# Patient Record
Sex: Male | Born: 1965
Health system: Southern US, Community
[De-identification: ages and names within clinical notes are randomized; demographics above are authoritative.]

## PROBLEM LIST (undated history)

## (undated) DIAGNOSIS — F419 Anxiety disorder, unspecified: Secondary | ICD-10-CM

## (undated) DIAGNOSIS — R972 Elevated prostate specific antigen [PSA]: Secondary | ICD-10-CM

## (undated) HISTORY — DX: Anxiety disorder, unspecified: F41.9

## (undated) HISTORY — DX: Elevated prostate specific antigen (PSA): R97.20

---

## 2006-04-24 ENCOUNTER — Ambulatory Visit: Payer: Self-pay | Admitting: Internal Medicine

## 2006-08-06 ENCOUNTER — Ambulatory Visit: Payer: Self-pay | Admitting: Family Medicine

## 2006-08-07 ENCOUNTER — Encounter: Admission: RE | Admit: 2006-08-07 | Discharge: 2006-08-07 | Payer: Self-pay | Admitting: Family Medicine

## 2006-08-24 ENCOUNTER — Ambulatory Visit: Payer: Self-pay | Admitting: Internal Medicine

## 2007-01-12 ENCOUNTER — Encounter (INDEPENDENT_AMBULATORY_CARE_PROVIDER_SITE_OTHER): Payer: Self-pay | Admitting: Family Medicine

## 2007-01-12 ENCOUNTER — Ambulatory Visit: Payer: Self-pay | Admitting: Family Medicine

## 2008-03-13 IMAGING — CR DG FINGER LITTLE 2+V*R*
1 series · 1 of 1 positions shown · non-contrast
Comparison: none

CLINICAL DATA: Hyperextension injury with soccer ball three days ago with DIP joint pain, right fifth finger.
 RIGHT FIFTH FINGER, THREE VIEWS:
 There is no evidence of fracture or focal bone lesions.  No other significant bone or soft tissue abnormalities are identified.

[view not recorded]
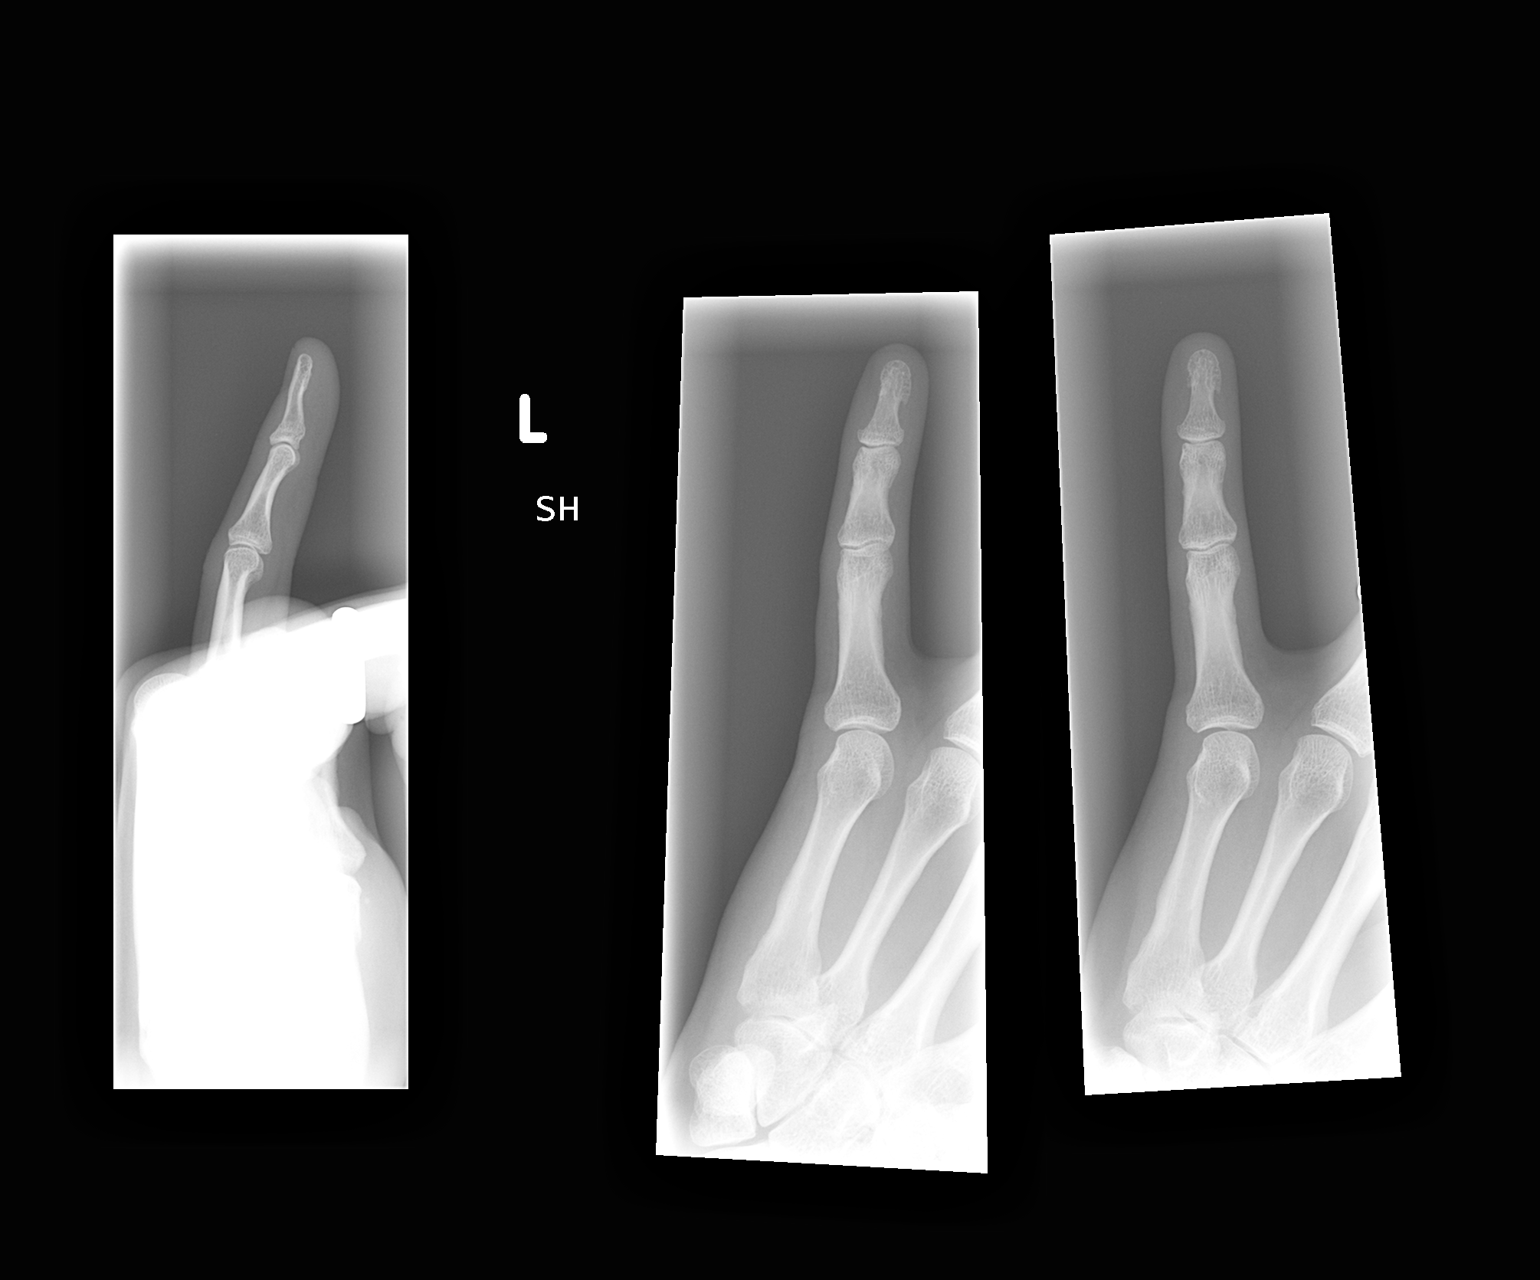

[1 of 1 positions shown; findings below may reference images not displayed]

IMPRESSION: Normal study.

## 2009-04-26 ENCOUNTER — Ambulatory Visit: Payer: Self-pay | Admitting: Internal Medicine

## 2009-04-26 DIAGNOSIS — M549 Dorsalgia, unspecified: Secondary | ICD-10-CM | POA: Insufficient documentation

## 2009-07-14 ENCOUNTER — Encounter: Payer: Self-pay | Admitting: Internal Medicine

## 2010-05-12 NOTE — Assessment & Plan Note (Signed)
Summary: BACKPAIN/KDC   Vital Signs:  Patient profile:   45 year old male Height:      73 inches Weight:      181.4 pounds BMI:     24.02 Pulse rate:   74 / minute BP sitting:   102 / 60  Vitals Entered By: Shary Decamp (April 26, 2009 12:46 PM) CC: acute only Comments  - low back pain  ~ 12/28, pain went away then restarted about 3 days ago.   - patient states pain started both times after shoveling snow  - pain down buttocks, legs (left leg worse than rt) Shary Decamp  April 26, 2009 12:48 PM    History of Present Illness: back pain on and off for the last few weeks usually related to shoveling snow or heavy lifting this episode started yesterday  after he moved fridge in his garage  the pain is a tightness, bilateral, and encompas  the whole L2  to sacral area some  radiation,L>R , to the posterior aspect of the thights symptoms worse if he rolls over in bed  or if he stands.  also, chronic on and off left ear discomfort  Current Medications (verified): 1)  Mvi  Allergies (verified): 1)  ! Pcn  Past History:  Past Medical History: no  Past Surgical History: no  Social History: married 2 boys Tobacco--no ETOH-- socially   Review of Systems       denies fever no bladder or bowel incontinence  Physical Exam  General:  alert, well-developed, and well-nourished.   Ears:  R ear normal and L ear normal.  Abdomen:  soft, non-tender, no masses, and no guarding.   Neurologic:  antalgic gait and posture motor and reflexes of the lower extremities normal straight leg test  is negative   Impression & Recommendations:  Problem # 1:  BACK PAIN (ICD-724.5)  acute LBP, likely a strain  His updated medication list for this problem includes:    Flexeril 10 Mg Tabs (Cyclobenzaprine hcl) ..... One by mouth as needed for pain twice a day    Vicodin 5-500 Mg Tabs (Hydrocodone-acetaminophen) ..... One or two by mouth every 6 hours as needed for  pain  Problem # 2:  see HPI if ear discomfort continue, he needs to see the ENT doctor  Complete Medication List: 1)  Mvi  2)  Prednisone 10 Mg Tabs (Prednisone) .... 4 by mouth once daily x 2, 3x2, 2x2, 1x2 3)  Flexeril 10 Mg Tabs (Cyclobenzaprine hcl) .... One by mouth as needed for pain twice a day 4)  Vicodin 5-500 Mg Tabs (Hydrocodone-acetaminophen) .... One or two by mouth every 6 hours as needed for pain  Patient Instructions: 1)  rest 2)  warm compresses 3)  prednisone as prescribed 4)  Flexeril, muscle relaxant, twice a day as needed.  Will cause drowsiness 5)  Vicodin, as needed for pain.  Will cause drowsiness 6)  call if you are not better in one week or if you have any tingling in the lower extremities Prescriptions: VICODIN 5-500 MG TABS (HYDROCODONE-ACETAMINOPHEN) one or two by mouth every 6 hours as needed for pain  #45 x 0   Entered and Authorized by:   Nolon Rod. Bethel Gaglio MD   Signed by:   Nolon Rod. Evanie Buckle MD on 04/26/2009   Method used:   Print then Give to Patient   RxID:   [id number] FLEXERIL 10 MG TABS (CYCLOBENZAPRINE HCL) one by mouth as needed for pain twice a  day  #30 x 0   Entered and Authorized by:   Nolon Rod. Jaciel Diem MD   Signed by:   Nolon Rod. Daphine Loch MD on 04/26/2009   Method used:   Print then Give to Patient   RxID:   [id number] PREDNISONE 10 MG TABS (PREDNISONE) 4 by mouth once daily x 2, 3x2, 2x2, 1x2  #20 x 0   Entered and Authorized by:   Elita Quick E. Shameeka Silliman MD   Signed by:   Nolon Rod. Babatunde Seago MD on 04/26/2009   Method used:   Print then Give to Patient   RxID:   [id number]

## 2010-05-12 NOTE — Consult Note (Signed)
Summary: hearing loss, tinnitus---[facility]  [facility]   Imported By: Lanelle Bal 07/21/2009 09:08:07  _____________________________________________________________________  External Attachment:    Type:   Image     Comment:   External Document

## 2010-08-26 NOTE — Assessment & Plan Note (Signed)
[facility]                        [facility] OFFICE NOTE   ADDIE, CEDERBERG                        MRN:          [id number]  DATE:08/06/2006                            DOB:          1966/02/04    REASON FOR VISIT:  Jammed left pinky finger.   Mr. Jowett is a 45 year old male who reports that Saturday he was trying  to catch a soccer ball underhanded when he jammed the finger on the  ball.  The pinky finger became very swollen and he complained of  tenderness at the distal joint.  He has not used any anti-inflammatory  or other therapy.  He was advised by EMS at the game to have his finger  rechecked.   HOME MEDICATIONS:  Centrum, glycine.   ALLERGIES:  PENICILLIN.   OBJECTIVE:  Weight 181.8, temperature 98.2, pulse 68, blood pressure  112/80.  GENERAL:  We have a pleasant male in no acute distress.  Answers  questions appropriately.  EXAMINATION OF THE FIFTH DIGIT:  Significant for a bluish discoloration  from the MIP joint to the distal phalanx.  There is tenderness over the  DIP joint, he does have full flexion of the finger without significant  discomfort.  No neurovascular compromise noted.   IMPRESSION:  Left fifth digit contusion, rule out fracture.   PLAN:  1. Finger was placed in a finger splint.  2. Will obtain an x-ray to rule out any fracture.  3. Anticipatory guidance was discussed with the patient.  4. Further recommendation as per the x-ray report.     Leanne Chang, M.D.  Electronically Signed    LA/MedQ  DD: 08/06/2006  DT: 08/06/2006  Job #: [id number]

## 2011-12-01 ENCOUNTER — Ambulatory Visit (INDEPENDENT_AMBULATORY_CARE_PROVIDER_SITE_OTHER): Payer: BC Managed Care – PPO | Admitting: Internal Medicine

## 2011-12-01 VITALS — BP 110/78 | HR 101 | Temp 98.2°F | Wt 176.0 lb

## 2011-12-01 DIAGNOSIS — L259 Unspecified contact dermatitis, unspecified cause: Secondary | ICD-10-CM

## 2011-12-01 MED ORDER — PREDNISONE 10 MG PO TABS: ORAL_TABLET | ORAL | Status: DC

## 2011-12-01 MED ORDER — DOXYCYCLINE HYCLATE 100 MG PO TABS: 100.0000 mg | ORAL_TABLET | Freq: Two times a day (BID) | ORAL | Status: AC

## 2011-12-01 NOTE — Assessment & Plan Note (Addendum)
Symptoms consistent with contact dermatitis, is severe at the left leg ( ?early cellulitis) Plan: Prednisone, doxycycline (avoid excessive sun exposure ), see instructions.

## 2011-12-01 NOTE — Progress Notes (Signed)
  Subjective:    Patient ID: Gregory Coffey, male    DOB: February 01, 1966, 46 y.o.   MRN: [id number]  HPI Acute visit, last seen in 2011. Patient did yard work about 6 days ago, the day after he developed a rash at the left leg which was quite pruritic. Since then has developed a rash at the right leg, arms and a small one at the left eyelid. He is using over-the-counter cortisone and a anti-itching itching gel---> not helping much.  Past Medical History: no  Past Surgical History: no  Social History: Married, 2 boys Tobacco--no ETOH-- socially   Review of Systems No fever chills, no joint aches No cough or shortness of breath    Objective:   Physical Exam  Constitutional: He appears well-developed and well-nourished. No distress.  Skin: He is not diaphoretic.     Psychiatric: He has a normal mood and affect. His behavior is normal. Judgment and thought content normal.   skin rash: See graphic, the worst is at the left leg. Rash is vesicular; at  the left leg it is oozing  a little bit, some redness and warmness as well.     Assessment & Plan:

## 2011-12-01 NOTE — Patient Instructions (Addendum)
Prednisone as prescribed Doxycycline for 5 days You can use over-the-counter calamine or a similar product to help with itching Claritin 10 mg over-the-counter once a day at night for few days to help with itching as well. Wash all your clothes Call anytime if you feel worse, have fever, the redness @ the leg is increasing.

## 2011-12-02 ENCOUNTER — Encounter: Payer: Self-pay | Admitting: Internal Medicine

## 2011-12-18 ENCOUNTER — Telehealth: Payer: Self-pay | Admitting: *Deleted

## 2011-12-18 MED ORDER — MOMETASONE FUROATE 0.1 % EX OINT: TOPICAL_OINTMENT | Freq: Two times a day (BID) | CUTANEOUS | Status: DC

## 2011-12-18 NOTE — Telephone Encounter (Signed)
Per Dr Alwyn Ren elocone 0.1 % oint bid to affected area and mix with Eucerin lotion. Discuss with patient,Rx sent

## 2011-12-18 NOTE — Telephone Encounter (Signed)
Pt still c/o redness, itching, dryness around elbow and knee after complete antibiotic and steroid.Pt notes that areas are sensitive to touch and sunlight. Pt indicated that symptoms did get better while on med but has began to flare up again.Gregory KitchenPlease advise

## 2011-12-31 ENCOUNTER — Encounter (HOSPITAL_BASED_OUTPATIENT_CLINIC_OR_DEPARTMENT_OTHER): Payer: Self-pay | Admitting: *Deleted

## 2011-12-31 ENCOUNTER — Emergency Department (HOSPITAL_BASED_OUTPATIENT_CLINIC_OR_DEPARTMENT_OTHER)
Admission: EM | Admit: 2011-12-31 | Discharge: 2011-12-31 | Disposition: A | Discharge status: Home / Self Care | Payer: BC Managed Care – PPO | Attending: Emergency Medicine | Admitting: Emergency Medicine

## 2011-12-31 DIAGNOSIS — R42 Dizziness and giddiness: Secondary | ICD-10-CM | POA: Insufficient documentation

## 2011-12-31 NOTE — ED Notes (Signed)
Pt states he noticed that around 3:30-4:00 he was "not himself". C/O muscle spasms. "Feel drunk, disoriented" Recent travel to [place].

## 2011-12-31 NOTE — ED Notes (Signed)
Patient states that he is feeling fine now and that whatever he was feeling has passed. Patient stated that he didn't mind waiting for the doctor, but that he just didn't feel like he needed to be seen at this time.

## 2012-01-01 ENCOUNTER — Ambulatory Visit (INDEPENDENT_AMBULATORY_CARE_PROVIDER_SITE_OTHER): Payer: BC Managed Care – PPO | Admitting: Internal Medicine

## 2012-01-01 ENCOUNTER — Encounter (HOSPITAL_COMMUNITY): Payer: Self-pay | Admitting: *Deleted

## 2012-01-01 ENCOUNTER — Emergency Department (HOSPITAL_COMMUNITY)
Admission: EM | Admit: 2012-01-01 | Discharge: 2012-01-01 | Disposition: A | Discharge status: Home / Self Care | Payer: BC Managed Care – PPO | Attending: Emergency Medicine | Admitting: Emergency Medicine

## 2012-01-01 VITALS — BP 92/68 | HR 97 | Temp 98.1°F | Wt 169.0 lb

## 2012-01-01 DIAGNOSIS — R259 Unspecified abnormal involuntary movements: Secondary | ICD-10-CM | POA: Insufficient documentation

## 2012-01-01 DIAGNOSIS — R4182 Altered mental status, unspecified: Secondary | ICD-10-CM | POA: Insufficient documentation

## 2012-01-01 DIAGNOSIS — M6240 Contracture of muscle, unspecified site: Secondary | ICD-10-CM

## 2012-01-01 DIAGNOSIS — F29 Unspecified psychosis not due to a substance or known physiological condition: Secondary | ICD-10-CM

## 2012-01-01 DIAGNOSIS — R410 Disorientation, unspecified: Secondary | ICD-10-CM

## 2012-01-01 LAB — CBC WITH DIFFERENTIAL/PLATELET
Basophils Relative: 0 % (ref 0–1)
Eosinophils Absolute: 0.1 10*3/uL (ref 0.0–0.7)
Eosinophils Relative: 1 % (ref 0–5)
Hemoglobin: 16.2 g/dL (ref 13.0–17.0)
Lymphs Abs: 2.4 10*3/uL (ref 0.7–4.0)
MCH: 29.6 pg (ref 26.0–34.0)
MCHC: 34.5 g/dL (ref 30.0–36.0)
MCV: 85.6 fL (ref 78.0–100.0)
Monocytes Relative: 8 % (ref 3–12)
Platelets: 280 10*3/uL (ref 150–400)
RBC: 5.48 MIL/uL (ref 4.22–5.81)

## 2012-01-01 LAB — COMPREHENSIVE METABOLIC PANEL
Albumin: 4.2 g/dL (ref 3.5–5.2)
BUN: 11 mg/dL (ref 6–23)
Calcium: 9.6 mg/dL (ref 8.4–10.5)
Creatinine, Ser: 1.1 mg/dL (ref 0.50–1.35)
GFR calc Af Amer: >90 mL/min (ref >90)
Glucose, Bld: 107 mg/dL — ABNORMAL HIGH (ref 70–99)
Total Protein: 7.1 g/dL (ref 6.0–8.3)

## 2012-01-01 LAB — RAPID URINE DRUG SCREEN, HOSP PERFORMED
Benzodiazepines: NOT DETECTED
Cocaine: NOT DETECTED
Opiates: NOT DETECTED

## 2012-01-01 LAB — URINALYSIS, ROUTINE W REFLEX MICROSCOPIC
Ketones, ur: NEGATIVE mg/dL
Leukocytes, UA: NEGATIVE
Nitrite: NEGATIVE
Specific Gravity, Urine: 1.022 (ref 1.005–1.030)
pH: 6 (ref 5.0–8.0)

## 2012-01-01 NOTE — Patient Instructions (Addendum)
Please go to the emergency room at [facility] now. I think you need for further evaluation tonight

## 2012-01-01 NOTE — ED Provider Notes (Signed)
History     CSN: [id number]  Arrival date & time 01/01/12  1725   First MD Initiated Contact with Patient 01/01/12 2124      Chief Complaint  Patient presents with  . Spasms    (Consider location/radiation/quality/duration/timing/severity/associated sxs/prior treatment) HPI Comments: Gregory Coffey is a 46 y.o. Male who presents with complaint of episodes of altered mental status and spasms all over the body. Pt stats first episode began yesterday. States episode lasted about 4 hrs. Another episode onset today, just resolved while waiting for a provider. States during that episode he had trouble focusing, trouble thinking and responding. States had involuntary twitches over the entire body. Reports wasn't able to control some movements and at one point, was trying to take a step with left leg but was moving right. Per wife, pt was alert during these episodes, but was less responsive, however, still following some commands and was able to communicate back some. Pt states he does remember what is happening and is able to understand people. Pt denies drugs, alcohol, new medications. He has no medical problems, does not take any meds. Recent work trip to [place] for 5 days, came back 3 days ago.    History reviewed. No pertinent past medical history.  History reviewed. No pertinent past surgical history.  No family history on file.  History  Substance Use Topics  . Smoking status: Never Smoker   . Smokeless tobacco: Not on file  . Alcohol Use: Yes      Review of Systems  Constitutional: Negative for fever, chills, appetite change and fatigue.  HENT: Negative for neck pain and neck stiffness.   Eyes: Negative for photophobia.  Respiratory: Negative.   Cardiovascular: Negative.   Genitourinary: Negative for dysuria, hematuria, flank pain and decreased urine volume.  Musculoskeletal: Negative for myalgias, back pain, joint swelling, arthralgias and gait problem.  Skin: Negative.     Neurological: Positive for tremors and weakness. Negative for dizziness, seizures, facial asymmetry, numbness and headaches.  Hematological: Negative.   Psychiatric/Behavioral: Negative.     Allergies  Penicillins  Home Medications  No current outpatient prescriptions on file.  BP 118/79  Pulse 85  Temp 98.4 F (36.9 C) (Oral)  Resp 20  SpO2 98%  Physical Exam  Nursing note and vitals reviewed. Constitutional: He is oriented to person, place, and time. He appears well-developed and well-nourished. No distress.  Cardiovascular: Normal rate, regular rhythm and normal heart sounds.   Pulmonary/Chest: Effort normal and breath sounds normal. No respiratory distress. He has no wheezes. He has no rales.  Abdominal: Soft. Bowel sounds are normal. He exhibits no distension. There is no tenderness. There is no rebound.  Musculoskeletal: Normal range of motion. He exhibits no edema.  Neurological: He is alert and oriented to person, place, and time. No cranial nerve deficit. Coordination normal.       Normal finger to nose. 2+ patellar reflexes bilaterally. 5/5 and equal upper, lower, and grip strength bilaterally. infoluntary spasms noted in the right quadricep muscle. No myoclonus, no cogwheel rigidity.  Skin: Skin is warm and dry.  Psychiatric: He has a normal mood and affect.    ED Course  Procedures (including critical care time)  Pt with episodes of altered mental status and jerking movements bilaterally all over body. Labs and UA pending.   Results for orders placed during the hospital encounter of 01/01/12  CBC WITH DIFFERENTIAL      Component Value Range   WBC 10.3  4.0 -  10.5 K/uL   RBC 5.48  4.22 - 5.81 MIL/uL   Hemoglobin 16.2  13.0 - 17.0 g/dL   HCT 40.9  81.1 - 91.4 %   MCV 85.6  78.0 - 100.0 fL   MCH 29.6  26.0 - 34.0 pg   MCHC 34.5  30.0 - 36.0 g/dL   RDW 78.2  95.6 - 21.3 %   Platelets 280  150 - 400 K/uL   Neutrophils Relative 68  43 - 77 %   Neutro Abs 7.0   1.7 - 7.7 K/uL   Lymphocytes Relative 23  12 - 46 %   Lymphs Abs 2.4  0.7 - 4.0 K/uL   Monocytes Relative 8  3 - 12 %   Monocytes Absolute 0.8  0.1 - 1.0 K/uL   Eosinophils Relative 1  0 - 5 %   Eosinophils Absolute 0.1  0.0 - 0.7 K/uL   Basophils Relative 0  0 - 1 %   Basophils Absolute 0.0  0.0 - 0.1 K/uL  COMPREHENSIVE METABOLIC PANEL      Component Value Range   Sodium 141  135 - 145 mEq/L   Potassium 3.7  3.5 - 5.1 mEq/L   Chloride 104  96 - 112 mEq/L   CO2 25  19 - 32 mEq/L   Glucose, Bld 107 (*) 70 - 99 mg/dL   BUN 11  6 - 23 mg/dL   Creatinine, Ser 0.86  0.50 - 1.35 mg/dL   Calcium 9.6  8.4 - 57.8 mg/dL   Total Protein 7.1  6.0 - 8.3 g/dL   Albumin 4.2  3.5 - 5.2 g/dL   AST 23  0 - 37 U/L   ALT 19  0 - 53 U/L   Alkaline Phosphatase 70  39 - 117 U/L   Total Bilirubin 0.5  0.3 - 1.2 mg/dL   GFR calc non Af Amer 79 (*) >90 mL/min   GFR calc Af Amer >90  >90 mL/min  URINALYSIS, ROUTINE W REFLEX MICROSCOPIC      Component Value Range   Color, Urine YELLOW  YELLOW   APPearance CLEAR  CLEAR   Specific Gravity, Urine 1.022  1.005 - 1.030   pH 6.0  5.0 - 8.0   Glucose, UA NEGATIVE  NEGATIVE mg/dL   Hgb urine dipstick NEGATIVE  NEGATIVE   Bilirubin Urine NEGATIVE  NEGATIVE   Ketones, ur NEGATIVE  NEGATIVE mg/dL   Protein, ur NEGATIVE  NEGATIVE mg/dL   Urobilinogen, UA 0.2  0.0 - 1.0 mg/dL   Nitrite NEGATIVE  NEGATIVE   Leukocytes, UA NEGATIVE  NEGATIVE  URINE RAPID DRUG SCREEN (HOSP PERFORMED)      Component Value Range   Opiates NONE DETECTED  NONE DETECTED   Cocaine NONE DETECTED  NONE DETECTED   Benzodiazepines NONE DETECTED  NONE DETECTED   Amphetamines NONE DETECTED  NONE DETECTED   Tetrahydrocannabinol NONE DETECTED  NONE DETECTED   Barbiturates NONE DETECTED  NONE DETECTED   No results found.  Pt currently asymptomatic. Also evaluated with Dr. Fonnie Jarvis, with no findings on exam. He discussed pt with Neurology, Dr. Roseanne Reno, who suggested that this does not sound  neurological. Other possibilities include metabolic or psychogenic processes. Pt stable right now to be d/c home with his wife and follow up closely. Pt does have a neurologist they will go see and pcp.   1. Altered mental state   2. Involuntary muscle contractions       MDM  Pt with two  episodes of altered mental status, jerking movements all over the body. He is currently symptom free, non toxic. He has no fever, no meningismus, noclonus, no cogwheel rigidity, he is taking no medications, no drugs or alcohol. DDx are seizure, metabolic process, or psychogenic process. At this time labs and ua unremarkable. He is medically cleared and is stable for d/c home. Advised no driving. Follow up with pcp or neurology.         Lottie Mussel, [place] 01/02/12 [phone]

## 2012-01-01 NOTE — ED Notes (Signed)
Pt and wife state that bilateral muscle twitching began yesterday around 1600. Pt has had several episodes of twitching of muscles in face, upper and lower extremities and strange sensation during respiration. Pt also reports "fogginess" and trouble concentrating during episodes.

## 2012-01-01 NOTE — ED Notes (Signed)
Spasms all over his body since yesterday

## 2012-01-01 NOTE — Progress Notes (Signed)
  Subjective:    Patient ID: Gregory Coffey, male    DOB: 1966/01/28, 46 y.o.   MRN: [id number]  HPI Acute visit, here with his wife The patient arrived from [place] 3 days ago, yesterday he started to feel unwell: Muscle spasms on and off, his mind was not clear "I couldn't  focus in more than one thing", wife noted him to beemotional, slightly confused? Yesterday he was seen by a friend who is a  neurologist, apparently exam was negative but was recommended to go to the ER. He went to the ER last night but left without being seen.  Past Medical History:  no  Past Surgical History:  no  Social History:  Married, 2 boys  Tobacco--no  ETOH-- socially    Review of Systems No  headache, no actual pain anywhere. No fever chills No nausea vomiting. He did have a small amount of watery stools in the last few days, no blood, no abdominal pain. Appetite is decreased. No chest pain shortness of breath No slurred speech evaluation. No bladder or bowel incontinence. No seizure activity per se. Denies the use of drugs, over-the-counter medications or OTCs. The last meds he took were doxycycline and prednisone for contact dermatitis last month.   Objective:   Physical Exam General -- alert, well-developed, and well-nourished.   Neck --FROM. No rigidity HEENT -- not pale or jaundice  Lungs -- normal respiratory effort, no intercostal retractions, no accessory muscle use, and normal breath sounds.   Heart-- normal rate, regular rhythm, no murmur, and no gallop.   Abdomen--soft, non-tender, no distention, no masses, no HSM, no guarding, and no rigidity.   Extremities-- no pretibial edema bilaterally Neurologic-- alert & oriented X3, he is lying in the examining table, I did notice jerking movements in his abdominal wall, extremities. DTRs and strength normal in all extremities. Does not seem to be confused at this point, his speech is very slow. Psych--  not anxious appearing and not depressed  appearing.      Assessment & Plan:   Patient presents today with involuntary movements, mild confusion? Was very emotional yesterday. He is afebrile, no meningeal signs, no rash. I'm not sure what is the etiology of his symptoms, hypocalcemia? botulism? Others?. I believe he needs further eval tonight including labs to rule out electrolyte mbalance, possibly a -ray, CT head, consultation with neurology, etc. Patient  elected to go to [facility] ER, I discussed the case with the ER physician.

## 2012-01-01 NOTE — ED Notes (Signed)
Patient is alert and oriented at this time.  Patient reports he has had episodes of not feeling right,  He states he cannot concentrate/feels confused or in a fog,  He states he feels like he has to concentrate on breathing.  Patient has reported twitching in his face in the jaw area and twitching in his arms and legs.  Wife is at bedside.

## 2012-01-02 ENCOUNTER — Telehealth: Payer: Self-pay | Admitting: Internal Medicine

## 2012-01-02 DIAGNOSIS — R410 Disorientation, unspecified: Secondary | ICD-10-CM

## 2012-01-02 DIAGNOSIS — R259 Unspecified abnormal involuntary movements: Secondary | ICD-10-CM

## 2012-01-02 NOTE — ED Provider Notes (Signed)
Medical screening examination/treatment/procedure(s) were conducted as a shared visit with non-physician practitioner(s) and myself.  I personally evaluated the patient during the encounter  D/w Pt/wife and Neuro who doubts neuro etiology given intermittent bilateral fleeting asynchronous myoclonic jerks.  Hurman Horn, MD 01/02/12 [phone]

## 2012-01-02 NOTE — Telephone Encounter (Signed)
Patient went to the ER, labs were normal, reportedly he felt better. I left a message asking for a call back, I like to see how he's doing and decide what is the next step we need to take

## 2012-01-03 NOTE — Telephone Encounter (Signed)
Patient scheduled his MRI for tomorrow, 01/04/12, with [facility], however he did mention he may change his mind and cancel the appointment.

## 2012-01-03 NOTE — Telephone Encounter (Signed)
Noted  

## 2012-01-03 NOTE — Telephone Encounter (Signed)
I have spoken with patient, he is aware his insurance has approved the MRI Brain, but states he is "away" right now, and will call me back by the end of today so I can get him scheduled.

## 2012-01-03 NOTE — Telephone Encounter (Signed)
thx

## 2012-01-03 NOTE — Telephone Encounter (Signed)
Patient back, overall feels better, has been essentially free of symptoms since yesterday, appetite is improving. He did report a headache described as a "heavy head". No fever chills, mild nausea, no diarrhea. He admits to some stress. I'm still concerned about the involuntary movements, as well as a question of mental status changes now with a "heavy head" Plan: Brain MRI, if negative will prescribe observation only. Patient will call us if symptoms are not completely well in few days. Call pt  at [phone]

## 2012-01-04 ENCOUNTER — Other Ambulatory Visit: Payer: BC Managed Care – PPO

## 2012-01-08 ENCOUNTER — Telehealth: Payer: Self-pay

## 2012-01-08 MED ORDER — ALPRAZOLAM 0.5 MG PO TABS: ORAL_TABLET | ORAL | Status: DC

## 2012-01-08 NOTE — Telephone Encounter (Signed)
Patient called a few days ago he is under a lot of stress. Plan: Alprazolam 0.5 mg 1/2 to one tab by mouth each bedtime when necessary #30, no refills. Call if side effects or excessive somnolence

## 2012-01-08 NOTE — Telephone Encounter (Signed)
Detailed msg left on vmail. rx sent to pharmacy.

## 2012-01-08 NOTE — Telephone Encounter (Signed)
Pt having trouble sleeping and would like you to advise him what to do. Plz advise   MW

## 2012-01-17 ENCOUNTER — Telehealth: Payer: Self-pay | Admitting: Internal Medicine

## 2012-01-17 ENCOUNTER — Ambulatory Visit (INDEPENDENT_AMBULATORY_CARE_PROVIDER_SITE_OTHER): Payer: BC Managed Care – PPO | Admitting: Family Medicine

## 2012-01-17 ENCOUNTER — Encounter: Payer: Self-pay | Admitting: Family Medicine

## 2012-01-17 VITALS — BP 112/82 | HR 100 | Temp 98.4°F | Ht 72.0 in | Wt 167.2 lb

## 2012-01-17 DIAGNOSIS — F419 Anxiety disorder, unspecified: Secondary | ICD-10-CM | POA: Insufficient documentation

## 2012-01-17 DIAGNOSIS — Z23 Encounter for immunization: Secondary | ICD-10-CM

## 2012-01-17 DIAGNOSIS — F411 Generalized anxiety disorder: Secondary | ICD-10-CM

## 2012-01-17 MED ORDER — ESCITALOPRAM OXALATE 10 MG PO TABS: 10.0000 mg | ORAL_TABLET | Freq: Every day | ORAL | Status: DC

## 2012-01-17 MED ORDER — CLONAZEPAM 0.5 MG PO TABS: 0.5000 mg | ORAL_TABLET | Freq: Every evening | ORAL | Status: DC | PRN

## 2012-01-17 NOTE — Assessment & Plan Note (Signed)
New to provider.  Severe.  Pt in need of controller med for anxiety.  Start lexapro.  Xanax prn for panicked moments.  Klonopin prn sleep.  Encouraged counseling.  Names and #s provided.  Pt asked for medical leave from work.  Will not provide this as I don't feel it will improve his anxiety but likely worsen it w/out normal structure/routine.  Will follow closely.  Total time spent w/ pt- 31 minutes, >50% spent counseling.

## 2012-01-17 NOTE — Telephone Encounter (Signed)
Pt treated by MD Beverely Low today in office

## 2012-01-17 NOTE — Telephone Encounter (Signed)
Ok to put at [employer]

## 2012-01-17 NOTE — Patient Instructions (Addendum)
Follow up in 1 month to recheck mood Start the Lexapro daily Use the Klonopin nightly for stress relief to allow sleep Use the xanax as needed for panicked moments Consider counseling Call with any questions or concerns Hang in there!!!

## 2012-01-17 NOTE — Telephone Encounter (Signed)
Spouse called would like to switch providers from [facility] to [place] -- Wife Wynona Canes stated pt is out of work due to his anxiety and would also like to be seen today, please review and advise and I will call patient back  Cb# [phone] PLEASE NOTE*- I currently have no 30-minute slots available til late December mid January for any provider.With that said if pt can be seen today advise which slots I can pull together to work him in

## 2012-01-17 NOTE — Progress Notes (Signed)
  Subjective:    Patient ID: Gregory Coffey, male    DOB: 10-08-1965, 46 y.o.   MRN: [id number]  HPI Anxiety- moved in August, middle of September went to [place] for work.  Sept 22 had 'anxiety attack' while moving stuff into the new house.  Had 2nd anxiety attack 1 week later.  3rd episode yesterday.  sxs will 'come over me like a wave'.  Hard time focusing.  Has been overwhelmed for last 2 months.  Will have racing heart, 'it feels like everything is closing down on me'.  Will feel shaky, lightheaded, confusion.  Has been to ER and saw Dr Drue Novel- blood work normal.   Review of Systems For ROS see HPI     Objective:   Physical Exam  Vitals reviewed. Constitutional: He appears well-developed and well-nourished.  Psychiatric:       Anxious Withdrawn Intermittently tearful          Assessment & Plan:

## 2012-01-17 NOTE — Telephone Encounter (Signed)
Please advise 

## 2012-03-12 ENCOUNTER — Encounter: Payer: Self-pay | Admitting: Family Medicine

## 2012-03-12 ENCOUNTER — Ambulatory Visit (INDEPENDENT_AMBULATORY_CARE_PROVIDER_SITE_OTHER): Payer: BC Managed Care – PPO | Admitting: Family Medicine

## 2012-03-12 VITALS — BP 100/60 | HR 103 | Temp 98.3°F | Ht 72.25 in | Wt 173.2 lb

## 2012-03-12 DIAGNOSIS — F411 Generalized anxiety disorder: Secondary | ICD-10-CM

## 2012-03-12 DIAGNOSIS — F419 Anxiety disorder, unspecified: Secondary | ICD-10-CM

## 2012-03-12 NOTE — Patient Instructions (Addendum)
Continue to take 1/2 tab every other day for 2 weeks If still feeling well, can stop the Lexapro If again feeling anxious, resume 1/2 tab every other day or daily Call with any questions or concerns Happy Holidays!!!

## 2012-03-12 NOTE — Progress Notes (Signed)
  Subjective:    Patient ID: Gregory Coffey, male    DOB: February 02, 1966, 46 y.o.   MRN: [id number]  HPI Anxiety- pt was started on Lexapro at last OV.  Reports that he is doing much better.  Some of the life stressors have calmed.  Has not had additional panic attacks since he was last here.  Has cut Lexapro down to 1/2 tab and feels sxs are well controlled.  Never started the xanax.   Review of Systems For ROS see HPI     Objective:   Physical Exam  Vitals reviewed. Constitutional: He appears well-developed and well-nourished. No distress.  Psychiatric: He has a normal mood and affect. His behavior is normal. Judgment and thought content normal.          Assessment & Plan:

## 2012-03-17 NOTE — Assessment & Plan Note (Signed)
Improved.  Nearly resolved.  Pt looking to wean meds.  Plan outlined.  Reviewed supportive care and red flags that should prompt return.  Pt expressed understanding and is in agreement w/ plan.

## 2013-04-11 ENCOUNTER — Encounter: Payer: Self-pay | Admitting: Family Medicine

## 2013-04-11 ENCOUNTER — Ambulatory Visit (INDEPENDENT_AMBULATORY_CARE_PROVIDER_SITE_OTHER): Payer: BC Managed Care – PPO | Admitting: Family Medicine

## 2013-04-11 VITALS — BP 128/72 | HR 107 | Temp 98.4°F | Resp 17 | Wt 180.5 lb

## 2013-04-11 DIAGNOSIS — M25519 Pain in unspecified shoulder: Secondary | ICD-10-CM

## 2013-04-11 DIAGNOSIS — M25511 Pain in right shoulder: Secondary | ICD-10-CM

## 2013-04-11 DIAGNOSIS — J069 Acute upper respiratory infection, unspecified: Secondary | ICD-10-CM

## 2013-04-11 MED ORDER — BENZONATATE 200 MG PO CAPS: 200.0000 mg | ORAL_CAPSULE | Freq: Three times a day (TID) | ORAL | Status: DC | PRN

## 2013-04-11 MED ORDER — GUAIFENESIN-CODEINE 100-10 MG/5ML PO SYRP: 10.0000 mL | ORAL_SOLUTION | Freq: Three times a day (TID) | ORAL | Status: DC | PRN

## 2013-04-11 NOTE — Assessment & Plan Note (Signed)
New.  Refer to ortho at pt's request

## 2013-04-11 NOTE — Assessment & Plan Note (Signed)
New.  No evidence of bacterial infxn, no need for abx.  Start cough meds prn.  Reviewed supportive care and red flags that should prompt return.  Pt expressed understanding and is in agreement w/ plan.

## 2013-04-11 NOTE — Progress Notes (Signed)
Pre visit review using our clinic review tool, if applicable. No additional management support is needed unless otherwise documented below in the visit note. 

## 2013-04-11 NOTE — Progress Notes (Signed)
   Subjective:    Patient ID: Gregory Coffey, male    DOB: [birth year], 48 y.o.   MRN: [id number]  HPI URI- 'i just can't shake this cough'.  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  No fever.  Cough is keeping him awake at night.  + frontal pain.  Cough is intermittently productive.  L ear pressure.  No N/V/D.  No anorexia.     Review of Systems For ROS see HPI     Objective:   Physical Exam  Vitals reviewed. Constitutional: He appears well-developed and well-nourished. No distress.  HENT:  Head: Normocephalic and atraumatic.  Right Ear: Tympanic membrane normal.  Left Ear: Tympanic membrane normal.  Nose: No mucosal edema or rhinorrhea. Right sinus exhibits no maxillary sinus tenderness and no frontal sinus tenderness. Left sinus exhibits no maxillary sinus tenderness and no frontal sinus tenderness.  Mouth/Throat: Mucous membranes are normal. No oropharyngeal exudate, posterior oropharyngeal edema or posterior oropharyngeal erythema.  Eyes: Conjunctivae and EOM are normal. Pupils are equal, round, and reactive to light.  Neck: Normal range of motion. Neck supple.  Cardiovascular: Normal rate, regular rhythm, normal heart sounds and intact distal pulses.   Pulmonary/Chest: Effort normal and breath sounds normal. No respiratory distress. He has no wheezes.  + hacking cough  Musculoskeletal:  R shoulder pain- full ROM, mild impingement signs  Lymphadenopathy:    He has no cervical adenopathy.  Skin: Skin is warm and dry.          Assessment & Plan:

## 2013-04-11 NOTE — Patient Instructions (Signed)
Follow up as needed Start the cough syrup as needed- will cause drowsiness Use the Tessalon for daytime cough Mucinex DM to thin congestion and suppress cough Drink plenty of fluids REST! Call with any questions or concerns Hang in there! Happy New Year!

## 2013-10-30 ENCOUNTER — Telehealth: Payer: Self-pay | Admitting: Family Medicine

## 2013-10-30 MED ORDER — TRIAMCINOLONE ACETONIDE 0.1 % EX CREA: 1.0000 "application " | TOPICAL_CREAM | Freq: Two times a day (BID) | CUTANEOUS | Status: DC

## 2013-10-30 NOTE — Telephone Encounter (Signed)
Pt can have Triamcinolone ointment to use twice daily but I cannot send script for prednisone w/o OV

## 2013-10-30 NOTE — Telephone Encounter (Signed)
Spoke with pt and advised that we sent in triamcinolone rx to his [facility]. Appt made with Selena Battenody on 7/24 @ 7:15am to evaluate rash due to pt going on vacation on saturday morning. Message routed to cody as FYI.

## 2013-10-30 NOTE — Telephone Encounter (Signed)
Caller name: Tylee  Call back number:[phone] Pharmacy: [facility] [address]   Reason for call:   Pt states he came across poison oak or ivy in his yard again while he was working in the yard. He has the same rash on his left calf, on his hip, and torso.   he has been seen before for this, and would like the same medication he got then.  Please advise.

## 2013-10-30 NOTE — Telephone Encounter (Signed)
Pt has not been seen since 04/2013.

## 2013-10-31 ENCOUNTER — Ambulatory Visit (INDEPENDENT_AMBULATORY_CARE_PROVIDER_SITE_OTHER): Payer: BC Managed Care – PPO | Admitting: Physician Assistant

## 2013-10-31 ENCOUNTER — Encounter: Payer: Self-pay | Admitting: Physician Assistant

## 2013-10-31 VITALS — BP 108/76 | HR 88 | Temp 98.4°F | Resp 16 | Ht 72.25 in | Wt 184.0 lb

## 2013-10-31 DIAGNOSIS — L255 Unspecified contact dermatitis due to plants, except food: Secondary | ICD-10-CM | POA: Insufficient documentation

## 2013-10-31 MED ORDER — PREDNISONE 10 MG PO TABS: ORAL_TABLET | ORAL | Status: DC

## 2013-10-31 NOTE — Patient Instructions (Signed)
Please take prednisone as directed.  Continue the steroid cream.  Use claritin or benadryl for itch.  Apply cool compresses.    Poison [employer] Poison ivy is a inflammation of the skin (contact dermatitis) caused by touching the allergens on the leaves of the ivy plant following previous exposure to the plant. The rash usually appears 48 hours after exposure. The rash is usually bumps (papules) or blisters (vesicles) in a linear pattern. Depending on your own sensitivity, the rash may simply cause redness and itching, or it may also progress to blisters which may break open. These must be well cared for to prevent secondary bacterial (germ) infection, followed by scarring. Keep any open areas dry, clean, dressed, and covered with an antibacterial ointment if needed. The eyes may also get puffy. The puffiness is worst in the morning and gets better as the day progresses. This dermatitis usually heals without scarring, within 2 to 3 weeks without treatment. HOME CARE INSTRUCTIONS  Thoroughly wash with soap and water as soon as you have been exposed to poison ivy. You have about one half hour to remove the plant resin before it will cause the rash. This washing will destroy the oil or antigen on the skin that is causing, or will cause, the rash. Be sure to wash under your fingernails as any plant resin there will continue to spread the rash. Do not rub skin vigorously when washing affected area. Poison ivy cannot spread if no oil from the plant remains on your body. A rash that has progressed to weeping sores will not spread the rash unless you have not washed thoroughly. It is also important to wash any clothes you have been wearing as these may carry active allergens. The rash will return if you wear the unwashed clothing, even several days later. Avoidance of the plant in the future is the best measure. Poison ivy plant can be recognized by the number of leaves. Generally, poison ivy has three leaves with flowering  branches on a single stem. Diphenhydramine may be purchased over the counter and used as needed for itching. Do not drive with this medication if it makes you drowsy.Ask your caregiver about medication for children. SEEK MEDICAL CARE IF:  Open sores develop.  Redness spreads beyond area of rash.  You notice purulent (pus-like) discharge.  You have increased pain.  Other signs of infection develop (such as fever). Document Released: 03/24/2000 Document Revised: 06/19/2011 Document Reviewed: 09/04/2008 [facility] Patient Information 2015 [employer] [place], [place]. This information is not intended to replace advice given to you by your health care provider. Make sure you discuss any questions you have with your health care provider.

## 2013-10-31 NOTE — Assessment & Plan Note (Signed)
Continue Triamcinolone ointment as directed.  Rx prednisone taper -- 40 mg x 3 days, 30 mg x 3 days, 20 mg x 3 day, 10 mg x 3 days.  Claritin or benadryl for pruritus.  Cold compresses.  Avoid excess sun or heat exposure.  Return precautions discussed with patient.

## 2013-10-31 NOTE — Progress Notes (Signed)
Patient presents to clinic today c/o pruritic and blistering rash of left lower leg, torso, and upper extremities bilaterally first noted a few days ago after working in the yard.  Patient with history of allergic reaction to rhus plants. Patient endorses significant pruritus.  Denies pain or drainage from rash.  Denies facial swelling or difficulty breathing.  Past Medical History  Diagnosis Date  . Anxiety     Current Outpatient Prescriptions on File Prior to Visit  Medication Sig Dispense Refill  . triamcinolone cream (KENALOG) 0.1 % Apply 1 application topically 2 (two) times daily.  30 g  0   No current facility-administered medications on file prior to visit.    Allergies  Allergen Reactions  . Penicillins     REACTION: hives    Family History  Problem Relation Age of Onset  . Stroke Maternal Grandmother     History   Social History  . Marital Status: Married    Spouse Name: N/A    Number of Children: N/A  . Years of Education: N/A   Social History Main Topics  . Smoking status: Never Smoker   . Smokeless tobacco: None  . Alcohol Use: Yes  . Drug Use: No  . Sexual Activity:    Other Topics Concern  . None   Social History Narrative  . None   Review of Systems - See HPI.  All other ROS are negative.  BP 108/76  Pulse 88  Temp(Src) 98.4 F (36.9 C) (Oral)  Resp 16  Ht 6' 0.25" (1.835 m)  Wt 184 lb (83.462 kg)  BMI 24.79 kg/m2  SpO2 98%  Physical Exam  Vitals reviewed. Constitutional: He is oriented to person, place, and time and well-developed, well-nourished, and in no distress.  HENT:  Head: Normocephalic and atraumatic.  Cardiovascular: Normal rate, regular rhythm, normal heart sounds and intact distal pulses.   Pulmonary/Chest: Effort normal and breath sounds normal. No respiratory distress. He has no wheezes. He has no rales. He exhibits no tenderness.  Neurological: He is alert and oriented to person, place, and time.  Skin: Skin is warm  and dry.  Presence of a papulovesicular rash following a linear distribution located on LLE.  Scattered papulovesicles noted of torso, neck and upper extremities bilaterally.   Assessment/Plan: Rhus dermatitis Continue Triamcinolone ointment as directed.  Rx prednisone taper -- 40 mg x 3 days, 30 mg x 3 days, 20 mg x 3 day, 10 mg x 3 days.  Claritin or benadryl for pruritus.  Cold compresses.  Avoid excess sun or heat exposure.  Return precautions discussed with patient.

## 2013-10-31 NOTE — Progress Notes (Signed)
Pre visit review using our clinic review tool, if applicable. No additional management support is needed unless otherwise documented below in the visit note/SLS  

## 2014-07-07 ENCOUNTER — Telehealth: Payer: Self-pay | Admitting: *Deleted

## 2014-07-07 NOTE — Telephone Encounter (Signed)
[facility] Forwarded to [email or web address] to scan/email to medical records. JG//CMA

## 2014-09-11 ENCOUNTER — Ambulatory Visit (INDEPENDENT_AMBULATORY_CARE_PROVIDER_SITE_OTHER): Payer: BLUE CROSS/BLUE SHIELD | Admitting: Family Medicine

## 2014-09-11 ENCOUNTER — Encounter: Payer: Self-pay | Admitting: Family Medicine

## 2014-09-11 VITALS — BP 110/78 | HR 100 | Temp 98.5°F | Resp 16 | Wt 180.4 lb

## 2014-09-11 DIAGNOSIS — J309 Allergic rhinitis, unspecified: Secondary | ICD-10-CM | POA: Insufficient documentation

## 2014-09-11 DIAGNOSIS — J3089 Other allergic rhinitis: Secondary | ICD-10-CM | POA: Diagnosis not present

## 2014-09-11 DIAGNOSIS — R05 Cough: Secondary | ICD-10-CM | POA: Insufficient documentation

## 2014-09-11 DIAGNOSIS — R058 Other specified cough: Secondary | ICD-10-CM | POA: Insufficient documentation

## 2014-09-11 MED ORDER — ALBUTEROL SULFATE HFA 108 (90 BASE) MCG/ACT IN AERS: 2.0000 | INHALATION_SPRAY | RESPIRATORY_TRACT | Status: DC | PRN

## 2014-09-11 MED ORDER — FLUTICASONE PROPIONATE 50 MCG/ACT NA SUSP: 2.0000 | Freq: Every day | NASAL | Status: DC

## 2014-09-11 MED ORDER — CETIRIZINE HCL 10 MG PO TABS: 10.0000 mg | ORAL_TABLET | Freq: Every day | ORAL | Status: DC

## 2014-09-11 NOTE — Patient Instructions (Signed)
Follow up as needed This is a combination of post-nasal drip (allergies) and a post-viral cough Drink plenty of fluids Start Ibuprofen (advil) 200 mg 3x/day w/ food until cough improves Use the Albuterol inhaler- 2 puffs if you have a coughing fit Continue the Flonase, add the Zyrtec daily (you have scripts for both so compare which is cheaper) Call with any questions or concerns Hang in there!!!

## 2014-09-11 NOTE — Progress Notes (Signed)
Pre visit review using our clinic review tool, if applicable. No additional management support is needed unless otherwise documented below in the visit note. 

## 2014-09-11 NOTE — Progress Notes (Signed)
   Subjective:    Patient ID: Gregory Coffey, male    DOB: [birth year], 49 y.o.   MRN: [id number]  HPI Cough- pt reports he initially had allergy symptoms that improved w/ Flonase.  ~1 month ago, had worsening nasal congestion and cough.  Congestion has since improved but cough persists x4 weeks.  Pt will have coughing jags and chest soreness.  No fevers.  Otherwise feeling well.  Cough triggered by cold, eating.   Review of Systems For ROS see HPI     Objective:   Physical Exam  Constitutional: He appears well-developed and well-nourished. No distress.  HENT:  Head: Normocephalic and atraumatic.  No TTP over sinuses + turbinate edema + PND TMs normal bilaterally  Eyes: Conjunctivae and EOM are normal. Pupils are equal, round, and reactive to light.  Neck: Normal range of motion. Neck supple.  Cardiovascular: Normal rate, regular rhythm and normal heart sounds.   Pulmonary/Chest: Effort normal and breath sounds normal. No respiratory distress. He has no wheezes.  Lymphadenopathy:    He has no cervical adenopathy.  Skin: Skin is warm and dry.  Vitals reviewed.         Assessment & Plan:

## 2014-09-13 NOTE — Assessment & Plan Note (Signed)
Deteriorated.  Pt to add OTC antihistamine to daily nasal steroid.  Reviewed supportive care and red flags that should prompt return.  Pt expressed understanding and is in agreement w/ plan.

## 2014-09-13 NOTE — Assessment & Plan Note (Signed)
New.  Pt's description of cough and otherwise feeling well, is consistent w/ post-infectious cough.  Start scheduled NSAIDs to address inflammation, albuterol prn for coughing fits.  Reviewed supportive care and red flags that should prompt return.  Pt expressed understanding and is in agreement w/ plan.

## 2014-09-18 ENCOUNTER — Ambulatory Visit: Payer: Self-pay | Admitting: Family Medicine

## 2015-04-01 ENCOUNTER — Ambulatory Visit (INDEPENDENT_AMBULATORY_CARE_PROVIDER_SITE_OTHER): Payer: BLUE CROSS/BLUE SHIELD | Admitting: Family Medicine

## 2015-04-01 ENCOUNTER — Encounter: Payer: Self-pay | Admitting: Family Medicine

## 2015-04-01 ENCOUNTER — Encounter (INDEPENDENT_AMBULATORY_CARE_PROVIDER_SITE_OTHER): Payer: Self-pay

## 2015-04-01 VITALS — BP 116/72 | HR 99 | Temp 98.4°F | Ht 73.0 in | Wt 181.0 lb

## 2015-04-01 DIAGNOSIS — J0100 Acute maxillary sinusitis, unspecified: Secondary | ICD-10-CM | POA: Diagnosis not present

## 2015-04-01 DIAGNOSIS — J019 Acute sinusitis, unspecified: Secondary | ICD-10-CM | POA: Insufficient documentation

## 2015-04-01 MED ORDER — PROMETHAZINE-DM 6.25-15 MG/5ML PO SYRP: 5.0000 mL | ORAL_SOLUTION | Freq: Four times a day (QID) | ORAL | Status: DC | PRN

## 2015-04-01 MED ORDER — SULFAMETHOXAZOLE-TRIMETHOPRIM 800-160 MG PO TABS: 1.0000 | ORAL_TABLET | Freq: Two times a day (BID) | ORAL | Status: DC

## 2015-04-01 NOTE — Assessment & Plan Note (Signed)
New.  Pt's sxs and PE consistent w/ infxn.  Start abx.  Cough meds prn.  Reviewed supportive care and red flags that should prompt return.  Pt expressed understanding and is in agreement w/ plan.  

## 2015-04-01 NOTE — Patient Instructions (Signed)
Follow up as needed Start the bactrim twice daily- take w/ food Drink plenty of fluids REST! Cough syrup as needed- will cause drowsiness Mucinex DM for daytime cough Call with any questions or concerns Hang in there! Happy Holidays!!!

## 2015-04-01 NOTE — Progress Notes (Signed)
   Subjective:    Patient ID: Gregory Coffey, male    DOB: 1965-11-01, 49 y.o.   MRN: [id number]  HPI URI- sxs started ~1 week ago after visiting 364 yr old nephew.  sxs started as sore throat.  Now w/ nasal congestion- thick, discolored, bloody drainage.  Pt reports nasal sores.  + sinus pain/pressure.  + HA.  Dry cough.  No fevers.  No N/V.   Review of Systems For ROS see HPI     Objective:   Physical Exam  Constitutional: He appears well-developed and well-nourished. No distress.  HENT:  Head: Normocephalic and atraumatic.  Right Ear: Tympanic membrane normal.  Left Ear: Tympanic membrane normal.  Nose: Mucosal edema and rhinorrhea present. Right sinus exhibits maxillary sinus tenderness and frontal sinus tenderness. Left sinus exhibits maxillary sinus tenderness and frontal sinus tenderness.  Mouth/Throat: Mucous membranes are normal. Oropharyngeal exudate and posterior oropharyngeal erythema present. No posterior oropharyngeal edema.  + PND  Eyes: Conjunctivae and EOM are normal. Pupils are equal, round, and reactive to light.  Neck: Normal range of motion. Neck supple.  Cardiovascular: Normal rate, regular rhythm and normal heart sounds.   Pulmonary/Chest: Effort normal and breath sounds normal. No respiratory distress. He has no wheezes.  Lymphadenopathy:    He has no cervical adenopathy.  Skin: Skin is warm and dry.  Vitals reviewed.         Assessment & Plan:

## 2015-04-01 NOTE — Progress Notes (Signed)
Pre visit review using our clinic review tool, if applicable. No additional management support is needed unless otherwise documented below in the visit note. 

## 2015-05-24 ENCOUNTER — Encounter: Payer: Self-pay | Admitting: Family Medicine

## 2015-05-24 ENCOUNTER — Ambulatory Visit (INDEPENDENT_AMBULATORY_CARE_PROVIDER_SITE_OTHER): Payer: BLUE CROSS/BLUE SHIELD | Admitting: Family Medicine

## 2015-05-24 VITALS — BP 100/80 | HR 92 | Temp 97.5°F | Ht 73.0 in | Wt 181.2 lb

## 2015-05-24 DIAGNOSIS — M5416 Radiculopathy, lumbar region: Secondary | ICD-10-CM | POA: Diagnosis not present

## 2015-05-24 MED ORDER — CYCLOBENZAPRINE HCL 10 MG PO TABS: 10.0000 mg | ORAL_TABLET | Freq: Three times a day (TID) | ORAL | Status: DC | PRN

## 2015-05-24 MED ORDER — PREDNISONE 10 MG PO TABS
ORAL_TABLET | ORAL | Status: DC
Start: 2015-05-24 — End: 2016-11-16

## 2015-05-24 NOTE — Patient Instructions (Signed)
Follow up as scheduled Start the Prednisone as directed- take w/ food Flexeril prior to bed- will cause drowsiness HEAT!!! Gentle stretching to avoid additional stiffness Call with any questions or concerns Happy Valentine's Day!!!

## 2015-05-24 NOTE — Progress Notes (Signed)
Pre visit review using our clinic review tool, if applicable. No additional management support is needed unless otherwise documented below in the visit note. 

## 2015-05-24 NOTE — Progress Notes (Signed)
   Subjective:    Patient ID: Gregory Coffey, male    DOB: 02-Jul-1965, 50 y.o.   MRN: [id number]  HPI Back pain- sxs started 1 week ago after heavy lifting (wife jumped in his arms and he wasn't ready).  Painful to lie down- difficulty sleeping.  Pain is midline and radiates into R buttock and upper thigh.  Some tingling in feet first thing in the AM.  Minimal relief w/ Advil.  No bowel or bladder incontinence.  Pain is most severe w/ leaning forward.  Pt has hx of similar 3-4 yrs ago.   Review of Systems For ROS see HPI     Objective:   Physical Exam  Constitutional: He is oriented to person, place, and time. He appears well-developed and well-nourished. No distress.  HENT:  Head: Normocephalic and atraumatic.  Cardiovascular: Intact distal pulses.   Musculoskeletal: He exhibits tenderness (TTP over lumbar spine and R sciatic notch).  Neurological: He is alert and oriented to person, place, and time. He has normal reflexes. No cranial nerve deficit.  Antalgic gait + SLR in R, (-) on L  Skin: Skin is warm and dry.  Psychiatric: He has a normal mood and affect. His behavior is normal. Thought content normal.  Vitals reviewed.         Assessment & Plan:

## 2015-05-25 NOTE — Assessment & Plan Note (Signed)
New.  Pt reports hx of similar 3-4 yrs ago.  Pt had acute injury last weekend and sxs have not been improving.  No relief w/ OTC NSAIDs.  Start pred taper, muscle relaxer.  Heat.  Reviewed supportive care and red flags that should prompt return.  Pt expressed understanding and is in agreement w/ plan.

## 2015-05-31 ENCOUNTER — Telehealth: Payer: Self-pay | Admitting: Family Medicine

## 2015-05-31 NOTE — Telephone Encounter (Signed)
Left message for patient, per Dr. Beverely Low.

## 2015-05-31 NOTE — Telephone Encounter (Signed)
Yes- he can continue w/ Colace or add OTC Miralax as needed for constipation

## 2015-05-31 NOTE — Telephone Encounter (Signed)
Relation to ZO:XWRU Call back number:[phone] Pharmacy:  Reason for call:   Patient states cyclobenzaprine (FLEXERIL) 10 MG tablet is causing constipation, please advise

## 2015-05-31 NOTE — Telephone Encounter (Signed)
Patient states he read up on side effects of Flexerill and he has had hard stools and constipation. States he took a muscle relaxer on Friday and Colace on Saturday and Sunday which has loosened him up

## 2015-06-01 ENCOUNTER — Telehealth: Payer: Self-pay | Admitting: *Deleted

## 2015-06-01 ENCOUNTER — Encounter: Payer: Self-pay | Admitting: *Deleted

## 2015-06-01 NOTE — Telephone Encounter (Signed)
Pre-Visit Call completed with patient and chart updated.   Pre-Visit Info documented in Specialty Comments under [employer].    

## 2015-06-02 ENCOUNTER — Ambulatory Visit (INDEPENDENT_AMBULATORY_CARE_PROVIDER_SITE_OTHER): Payer: BLUE CROSS/BLUE SHIELD | Admitting: Family Medicine

## 2015-06-02 ENCOUNTER — Encounter: Payer: Self-pay | Admitting: Family Medicine

## 2015-06-02 VITALS — BP 100/80 | HR 97 | Temp 98.5°F | Ht 73.0 in | Wt 179.4 lb

## 2015-06-02 DIAGNOSIS — Z0000 Encounter for general adult medical examination without abnormal findings: Secondary | ICD-10-CM | POA: Diagnosis not present

## 2015-06-02 DIAGNOSIS — Z1211 Encounter for screening for malignant neoplasm of colon: Secondary | ICD-10-CM

## 2015-06-02 DIAGNOSIS — M5416 Radiculopathy, lumbar region: Secondary | ICD-10-CM

## 2015-06-02 LAB — CBC WITH DIFFERENTIAL/PLATELET
BASOS ABS: 0 10*3/uL (ref 0.0–0.1)
Basophils Relative: 0.3 % (ref 0.0–3.0)
EOS ABS: 0.1 10*3/uL (ref 0.0–0.7)
Eosinophils Relative: 1.2 % (ref 0.0–5.0)
HEMATOCRIT: 47.2 % (ref 39.0–52.0)
Hemoglobin: 15.8 g/dL (ref 13.0–17.0)
LYMPHS PCT: 27.4 % (ref 12.0–46.0)
Lymphs Abs: 2.5 10*3/uL (ref 0.7–4.0)
MCHC: 33.4 g/dL (ref 30.0–36.0)
MCV: 87 fl (ref 78.0–100.0)
Monocytes Absolute: 0.4 10*3/uL (ref 0.1–1.0)
Monocytes Relative: 3.8 % (ref 3.0–12.0)
NEUTROS ABS: 6.3 10*3/uL (ref 1.4–7.7)
NEUTROS PCT: 67.3 % (ref 43.0–77.0)
PLATELETS: 261 10*3/uL (ref 150.0–400.0)
RBC: 5.42 Mil/uL (ref 4.22–5.81)
RDW: 13.6 % (ref 11.5–15.5)
WBC: 9.3 10*3/uL (ref 4.0–10.5)

## 2015-06-02 LAB — BASIC METABOLIC PANEL
BUN: 18 mg/dL (ref 6–23)
CALCIUM: 9.3 mg/dL (ref 8.4–10.5)
CO2: 30 meq/L (ref 19–32)
CREATININE: 1.14 mg/dL (ref 0.40–1.50)
Chloride: 102 mEq/L (ref 96–112)
GFR: 72.27 mL/min (ref >60.00)
GLUCOSE: 83 mg/dL (ref 70–99)
Potassium: 4.3 mEq/L (ref 3.5–5.1)
SODIUM: 138 meq/L (ref 135–145)

## 2015-06-02 LAB — HEPATIC FUNCTION PANEL
ALBUMIN: 4.2 g/dL (ref 3.5–5.2)
ALT: 26 U/L (ref 0–53)
AST: 19 U/L (ref 0–37)
Alkaline Phosphatase: 74 U/L (ref 39–117)
BILIRUBIN DIRECT: 0.1 mg/dL (ref 0.0–0.3)
TOTAL PROTEIN: 6.6 g/dL (ref 6.0–8.3)
Total Bilirubin: 0.7 mg/dL (ref 0.2–1.2)

## 2015-06-02 LAB — LIPID PANEL
CHOLESTEROL: 165 mg/dL (ref 0–200)
HDL: 54.4 mg/dL (ref >39.00)
LDL CALC: 97 mg/dL (ref 0–99)
NonHDL: 110.81
TRIGLYCERIDES: 70 mg/dL (ref 0.0–149.0)
Total CHOL/HDL Ratio: 3
VLDL: 14 mg/dL (ref 0.0–40.0)

## 2015-06-02 LAB — TSH: TSH: 2.44 u[IU]/mL (ref 0.35–4.50)

## 2015-06-02 LAB — PSA: PSA: 1.8 ng/mL (ref 0.10–4.00)

## 2015-06-02 NOTE — Progress Notes (Signed)
   Subjective:    Patient ID: Gregory Coffey, male    DOB: 1965-11-30, 50 y.o.   MRN: [id number]  HPI CPE- pt is turning 50 and will need a colonoscopy referral.     Review of Systems Patient reports no vision/hearing changes, anorexia, fever ,adenopathy, persistant/recurrent hoarseness, swallowing issues, chest pain, palpitations, edema, persistant/recurrent cough, hemoptysis, dyspnea (rest,exertional, paroxysmal nocturnal), gastrointestinal  bleeding (melena, rectal bleeding), abdominal pain, excessive heart burn, GU symptoms (dysuria, hematuria, voiding/incontinence issues) syncope, focal weakness, memory loss, numbness & tingling, skin/hair/nail changes, depression, anxiety, abnormal bruising/bleeding.     Objective:   Physical Exam General Appearance:    Alert, cooperative, no distress, appears stated age  Head:    Normocephalic, without obvious abnormality, atraumatic  Eyes:    PERRL, conjunctiva/corneas clear, EOM's intact, fundi    benign, both eyes       Ears:    Normal TM's and external ear canals, both ears  Nose:   Nares normal, septum midline, mucosa normal, no drainage   or sinus tenderness  Throat:   Lips, mucosa, and tongue normal; teeth and gums normal  Neck:   Supple, symmetrical, trachea midline, no adenopathy;       thyroid:  No enlargement/tenderness/nodules  Back:     Symmetric, no curvature, ROM normal, no CVA tenderness  Lungs:     Clear to auscultation bilaterally, respirations unlabored  Chest wall:    No tenderness or deformity  Heart:    Regular rate and rhythm, S1 and S2 normal, no murmur, rub   or gallop  Abdomen:     Soft, non-tender, bowel sounds active all four quadrants,    no masses, no organomegaly  Genitalia:    Normal male without lesion, masses,discharge or tenderness  Rectal:    Deferred due to back pain  Extremities:   Extremities normal, atraumatic, no cyanosis or edema  Pulses:   2+ and symmetric all extremities  Skin:   Skin color, texture,  turgor normal, no rashes or lesions  Lymph nodes:   Cervical, supraclavicular, and axillary nodes normal  Neurologic:   CNII-XII intact. Normal strength, sensation and reflexes      throughout          Assessment & Plan:

## 2015-06-02 NOTE — Patient Instructions (Signed)
Follow up in 1 year or as needed We'll notify you of your lab results and make any changes if needed We'll call you with your GI and PT appts Call with any questions or concerns Hang in there! HAPPY BIRTHDAY!!!

## 2015-06-02 NOTE — Assessment & Plan Note (Signed)
Improving but still uncomfortable for pt.  Since this has been >1 week, will refer to PT for evaluation and tx.  Pt expressed understanding and is in agreement w/ plan.

## 2015-06-02 NOTE — Progress Notes (Signed)
Pre visit review using our clinic review tool, if applicable. No additional management support is needed unless otherwise documented below in the visit note. 

## 2015-06-02 NOTE — Assessment & Plan Note (Signed)
Pt's PE WNL.  Refused flu shot.  Pt is turning 50- GI referral entered.  Check labs.  Anticipatory guidance provided.

## 2015-06-03 ENCOUNTER — Encounter: Payer: Self-pay | Admitting: General Practice

## 2016-11-15 ENCOUNTER — Ambulatory Visit (INDEPENDENT_AMBULATORY_CARE_PROVIDER_SITE_OTHER): Payer: BLUE CROSS/BLUE SHIELD | Admitting: Family Medicine

## 2016-11-15 VITALS — BP 101/81 | HR 104 | Temp 98.3°F | Resp 16 | Ht 73.0 in | Wt 184.1 lb

## 2016-11-15 DIAGNOSIS — Z23 Encounter for immunization: Secondary | ICD-10-CM

## 2016-11-15 DIAGNOSIS — R0982 Postnasal drip: Secondary | ICD-10-CM

## 2016-11-16 ENCOUNTER — Encounter: Payer: Self-pay | Admitting: Family Medicine

## 2016-11-16 DIAGNOSIS — Z23 Encounter for immunization: Secondary | ICD-10-CM | POA: Diagnosis not present

## 2016-11-16 NOTE — Progress Notes (Signed)
Pre visit review using our clinic review tool, if applicable. No additional management support is needed unless otherwise documented below in the visit note. 

## 2016-11-17 NOTE — Patient Instructions (Signed)
Schedule your complete physical at your convenience Use the Flonase daily- 2 sprays each nostril using the opposite hand Drink plenty of fluids to help w/ the cough Complete the cologuard as directed Call with any questions or concerns Enjoy the rest of your summer!!!

## 2016-11-17 NOTE — Progress Notes (Signed)
   Subjective:    Patient ID: Gregory Coffey, male    DOB: Aug 20, 1965, 51 y.o.   MRN: [id number]  HPI Cough- pt notes cough starts during spring allergies.  'lasts for a couple of weeks and is controlled w/ Flonase'.  This year cough has been persisting- 'loud, dry, nonproductive'.  No SOB.  Decreased athletic endurance.  Wife notes he needs to take large, deep breaths.  Not currently taking allergy meds- will use Flonase PRN.  Occasional reflux.  Colon cancer screen- pt opts for cologuard  Review of Systems For ROS see HPI     Objective:   Physical Exam  Constitutional: He appears well-developed and well-nourished. No distress.  HENT:  Head: Normocephalic and atraumatic.  No TTP over sinuses + turbinate edema + PND TMs normal bilaterally  Eyes: Pupils are equal, round, and reactive to light. Conjunctivae and EOM are normal.  Neck: Normal range of motion. Neck supple.  Cardiovascular: Normal rate, regular rhythm and normal heart sounds.   Pulmonary/Chest: Effort normal and breath sounds normal. No respiratory distress. He has no wheezes.  Lymphadenopathy:    He has no cervical adenopathy.  Skin: Skin is warm and dry.  Vitals reviewed.         Assessment & Plan:  PND- new.  Pt's cough is most likely due to untreated PND.  No red flags on hx or PE.  Restart nasal steroid daily.  Reviewed supportive care and red flags that should prompt return.  Pt expressed understanding and is in agreement w/ plan.   Colon cancer screen- pt to complete and return cologuard as directed.

## 2016-11-29 ENCOUNTER — Telehealth: Payer: Self-pay | Admitting: Family Medicine

## 2016-11-29 NOTE — Telephone Encounter (Signed)
Please inform pt that we have no way to get him an InSure ONE kit as their representatives have not set up an account with our office.  I would encourage him to try and get an exception w/ his insurance or call the billing department at [facility] to see how much this could cost him out of pocket.

## 2016-11-29 NOTE — Telephone Encounter (Signed)
Called pt and left a detailed message to  advise on PCP recommendations. Also gave him the number for cologuard to find out what his exact charge is. Pt was also advised that the insureone test looks for hemoglobin in the stool where as the cologuard is actually looking to make sure he does not have any colon cancer causing cells.

## 2016-11-29 NOTE — Telephone Encounter (Signed)
Please advise? I have no way of sending the insure One kit to the pt

## 2016-11-29 NOTE — Telephone Encounter (Signed)
Pt states that he has received the kit for cologuard, however his insurance does not cover this test and states that InSure ONE is the test they do cover. Pt asking if this kit could be mailed to him and is asking what should her do with the cologuard test kit. Please advise.

## 2017-04-26 DIAGNOSIS — H5213 Myopia, bilateral: Secondary | ICD-10-CM | POA: Diagnosis not present

## 2017-04-26 DIAGNOSIS — H52203 Unspecified astigmatism, bilateral: Secondary | ICD-10-CM | POA: Diagnosis not present

## 2017-04-26 DIAGNOSIS — H02401 Unspecified ptosis of right eyelid: Secondary | ICD-10-CM | POA: Diagnosis not present

## 2018-01-04 ENCOUNTER — Ambulatory Visit (INDEPENDENT_AMBULATORY_CARE_PROVIDER_SITE_OTHER): Payer: BLUE CROSS/BLUE SHIELD | Admitting: Family Medicine

## 2018-01-04 ENCOUNTER — Encounter: Payer: Self-pay | Admitting: Family Medicine

## 2018-01-04 ENCOUNTER — Other Ambulatory Visit: Payer: Self-pay

## 2018-01-04 VITALS — BP 121/80 | HR 85 | Temp 98.6°F | Resp 16 | Ht 73.0 in | Wt 185.0 lb

## 2018-01-04 DIAGNOSIS — J301 Allergic rhinitis due to pollen: Secondary | ICD-10-CM

## 2018-01-04 DIAGNOSIS — Z1211 Encounter for screening for malignant neoplasm of colon: Secondary | ICD-10-CM | POA: Diagnosis not present

## 2018-01-04 DIAGNOSIS — R058 Other specified cough: Secondary | ICD-10-CM

## 2018-01-04 DIAGNOSIS — R05 Cough: Secondary | ICD-10-CM

## 2018-01-04 DIAGNOSIS — H6983 Other specified disorders of Eustachian tube, bilateral: Secondary | ICD-10-CM | POA: Diagnosis not present

## 2018-01-04 DIAGNOSIS — H9312 Tinnitus, left ear: Secondary | ICD-10-CM

## 2018-01-04 MED ORDER — PREDNISONE 10 MG PO TABS: ORAL_TABLET | ORAL | 0 refills | Status: DC

## 2018-01-04 MED ORDER — CETIRIZINE HCL 10 MG PO TABS: 10.0000 mg | ORAL_TABLET | Freq: Every day | ORAL | 11 refills | Status: AC

## 2018-01-04 NOTE — Assessment & Plan Note (Signed)
New.  Pt's sxs consistent w/ post-viral cough.  Add daily antihistamine to improve PND.  Start Pred taper as directed.

## 2018-01-04 NOTE — Assessment & Plan Note (Signed)
Deteriorated.  Add Zyrtec daily.  Continue Flonase.  Pt expressed understanding and is in agreement w/ plan.

## 2018-01-04 NOTE — Progress Notes (Signed)
   Subjective:    Patient ID: Gregory Coffey, male    DOB: 12-11-1965, 52 y.o.   MRN: [id number]  HPI Cough- pt reports he 'caught something' in early August.  Since then, he has had a 'nagging cough'.  Cough is intermittently productive of thick sputum.  Cough will trigger with cold and once cough is triggered, will continue as spasmodic cough.  L sided tinnitus- when lying on L side will develop an intermittent 'screech'.  Reports dullness and pressure in L ear.     Review of Systems For ROS see HPI     Objective:   Physical Exam  Constitutional: He appears well-developed and well-nourished. No distress.  HENT:  Head: Normocephalic and atraumatic.  No TTP over sinuses + turbinate edema + PND TMs retracted bilaterally w/ multiple scars visible  Eyes: Pupils are equal, round, and reactive to light. Conjunctivae and EOM are normal.  Neck: Normal range of motion. Neck supple.  Cardiovascular: Normal rate, regular rhythm and normal heart sounds.  Pulmonary/Chest: Effort normal and breath sounds normal. No respiratory distress. He has no wheezes.  1 dry cough heard  Lymphadenopathy:    He has no cervical adenopathy.  Skin: Skin is warm and dry.  Vitals reviewed.         Assessment & Plan:  Tinnitus- pt has hx of this but it's deteriorating.  Refer to ENT for complete evaluation.  Pt expressed understanding and is in agreement w/ plan.   Eustachian Tube Dysfxn- new.  Start daily antihistamine in addition to nasal steroid.  Refer to ENT for evaluation.  Pt expressed understanding and is in agreement w/ plan.

## 2018-01-04 NOTE — Patient Instructions (Addendum)
Schedule your complete physical at your convenience (you have not had one since Feb 2017) START the Prednisone tomorrow- 3 tabs at the same time x3 days and then decrease as directed CONTINUE the Flonase ADD the Zyrtec daily to improve nasal congestion and ear pressure We'll call you with your ENT and GI appts Call with any questions or concerns Hang in there!!!

## 2018-02-07 DIAGNOSIS — Z1211 Encounter for screening for malignant neoplasm of colon: Secondary | ICD-10-CM | POA: Diagnosis not present

## 2018-02-15 DIAGNOSIS — H9313 Tinnitus, bilateral: Secondary | ICD-10-CM | POA: Diagnosis not present

## 2018-02-18 ENCOUNTER — Encounter: Payer: Self-pay | Admitting: Gastroenterology

## 2018-02-18 DIAGNOSIS — K635 Polyp of colon: Secondary | ICD-10-CM | POA: Diagnosis not present

## 2018-02-18 DIAGNOSIS — D125 Benign neoplasm of sigmoid colon: Secondary | ICD-10-CM | POA: Diagnosis not present

## 2018-02-18 DIAGNOSIS — D120 Benign neoplasm of cecum: Secondary | ICD-10-CM | POA: Diagnosis not present

## 2018-02-18 DIAGNOSIS — K6389 Other specified diseases of intestine: Secondary | ICD-10-CM | POA: Diagnosis not present

## 2018-02-18 DIAGNOSIS — Z1211 Encounter for screening for malignant neoplasm of colon: Secondary | ICD-10-CM | POA: Diagnosis not present

## 2018-02-18 LAB — HM COLONOSCOPY

## 2018-02-28 ENCOUNTER — Encounter: Payer: Self-pay | Admitting: General Practice

## 2018-02-28 DIAGNOSIS — J328 Other chronic sinusitis: Secondary | ICD-10-CM | POA: Diagnosis not present

## 2018-02-28 DIAGNOSIS — J342 Deviated nasal septum: Secondary | ICD-10-CM | POA: Diagnosis not present

## 2018-02-28 DIAGNOSIS — J329 Chronic sinusitis, unspecified: Secondary | ICD-10-CM | POA: Diagnosis not present

## 2018-02-28 DIAGNOSIS — R0982 Postnasal drip: Secondary | ICD-10-CM | POA: Diagnosis not present

## 2018-02-28 DIAGNOSIS — R05 Cough: Secondary | ICD-10-CM | POA: Diagnosis not present

## 2018-02-28 DIAGNOSIS — J3489 Other specified disorders of nose and nasal sinuses: Secondary | ICD-10-CM | POA: Diagnosis not present

## 2018-02-28 DIAGNOSIS — J343 Hypertrophy of nasal turbinates: Secondary | ICD-10-CM | POA: Diagnosis not present

## 2018-02-28 DIAGNOSIS — Z79899 Other long term (current) drug therapy: Secondary | ICD-10-CM | POA: Diagnosis not present

## 2018-02-28 DIAGNOSIS — J301 Allergic rhinitis due to pollen: Secondary | ICD-10-CM | POA: Diagnosis not present

## 2018-02-28 DIAGNOSIS — J309 Allergic rhinitis, unspecified: Secondary | ICD-10-CM | POA: Diagnosis not present

## 2018-05-07 DIAGNOSIS — H5213 Myopia, bilateral: Secondary | ICD-10-CM | POA: Diagnosis not present

## 2018-05-07 DIAGNOSIS — H524 Presbyopia: Secondary | ICD-10-CM | POA: Diagnosis not present

## 2018-08-28 ENCOUNTER — Encounter: Payer: Self-pay | Admitting: Family Medicine

## 2018-08-28 ENCOUNTER — Telehealth: Payer: Self-pay | Admitting: Family Medicine

## 2018-08-28 ENCOUNTER — Ambulatory Visit (INDEPENDENT_AMBULATORY_CARE_PROVIDER_SITE_OTHER): Payer: BLUE CROSS/BLUE SHIELD | Admitting: Family Medicine

## 2018-08-28 VITALS — Temp 96.1°F | Ht 73.0 in | Wt 190.0 lb

## 2018-08-28 DIAGNOSIS — M25511 Pain in right shoulder: Secondary | ICD-10-CM

## 2018-08-28 DIAGNOSIS — L255 Unspecified contact dermatitis due to plants, except food: Secondary | ICD-10-CM

## 2018-08-28 DIAGNOSIS — G8929 Other chronic pain: Secondary | ICD-10-CM | POA: Diagnosis not present

## 2018-08-28 MED ORDER — PREDNISONE 10 MG PO TABS: ORAL_TABLET | ORAL | 0 refills | Status: DC

## 2018-08-28 MED ORDER — TRIAMCINOLONE ACETONIDE 0.1 % EX OINT: 1.0000 "application " | TOPICAL_OINTMENT | Freq: Two times a day (BID) | CUTANEOUS | 1 refills | Status: DC

## 2018-08-28 NOTE — Telephone Encounter (Signed)
Ok for virtual visits for both pts

## 2018-08-28 NOTE — Progress Notes (Signed)
I have discussed the procedure for the virtual visit with the patient who has given consent to proceed with assessment and treatment.   Jessica L Brodmerkel, CMA     

## 2018-08-28 NOTE — Progress Notes (Signed)
   Virtual Visit via Video   I connected with patient on 08/28/18 at  3:00 PM EDT by a video enabled telemedicine application and verified that I am speaking with the correct person using two identifiers.  Location patient: Home Location provider: [facility], Office Persons participating in the virtual visit: Patient, Provider, CMA (Jess B)  I discussed the limitations of evaluation and management by telemedicine and the availability of in person appointments. The patient expressed understanding and agreed to proceed.  Subjective:   HPI:   Poison ivy- was playing Disc Golf and now has rash on arm, cheek, chin, and torso.  Sxs started Monday night.  Worsened yesterday.  Using Calamine lotion w/ minimal relief.    R shoulder pain- chronic problem but has worsened over the past 2 weeks.  Now reports clicking and limited ROM.  Pain w/ abduction, rolling over in bed- pain is now sharp.  ROS:   See pertinent positives and negatives per HPI.  Patient Active Problem List   Diagnosis Date Noted  . Physical exam 06/02/2015  . Lumbar radiculopathy, acute 05/24/2015  . Sinusitis, acute 04/01/2015  . Allergic rhinitis 09/11/2014  . Post-viral cough syndrome 09/11/2014  . Rhus dermatitis 10/31/2013  . Pain in right shoulder 04/11/2013  . Viral URI 04/11/2013  . Anxiety 01/17/2012    Social History   Tobacco Use  . Smoking status: Never Smoker  . Smokeless tobacco: Never Used  Substance Use Topics  . Alcohol use: Yes    Current Outpatient Medications:  .  cetirizine (ZYRTEC) 10 MG tablet, Take 1 tablet (10 mg total) by mouth daily., Disp: 30 tablet, Rfl: 11 .  fluticasone (FLONASE) 50 MCG/ACT nasal spray, Place 2 sprays into both nostrils daily. (Patient taking differently: Place 2 sprays into both nostrils daily as needed. ), Disp: 16 g, Rfl: 6  Allergies  Allergen Reactions  . Penicillins Hives    Objective:   Temp (!) 96.1 F (35.6 C) (Oral)   Ht 6\' 1"  (1.854 m)    Wt 190 lb (86.2 kg)   BMI 25.07 kg/m   AAOx3, NAD NCAT, EOMI No obvious CN deficits Coloring WNL Pt is able to speak clearly, coherently without shortness of breath or increased work of breathing.  Thought process is linear.  Mood is appropriate.  Pt w/ vesicular rash on R hand, forearm, chin consistent w/ plant dermatitis  Assessment and Plan:   R shoulder pain- chronic but worsening.  Pt is asking for Ortho referral.  Referral placed.  Poison Ivy- new.  Pt has hx of this and tends to have a vigorous reaction.  Start Prednisone given distribution.  Cautioned about weakened immune system in COVID setting.  Pt is able to stay home and self isolate.  Reviewed supportive care and red flags that should prompt return.  Pt expressed understanding and is in agreement w/ plan.    Neena Rhymes, MD 08/28/2018

## 2018-08-28 NOTE — Telephone Encounter (Signed)
Ok for VV for both? Or would you like him to come in for evaluation?

## 2018-08-28 NOTE — Telephone Encounter (Signed)
Pt states that he was doing yard work and now has itchy rash possible poison ivy that has traveled over the body in places and now to his chin and cheek on face. Please advise on if this needs to be an office visit.   Pt states that his wide also has this on her legs & arms, but not her face. Christine, who is also KT pt. May need VV.  Pt states that he is also still having issues with his shoulder that KT has address in the past that may need a referral. I let pt know that since these are two different issues that we may need to address should at a later time, but I would make Dr. Beverely Low aware of this.

## 2018-08-28 NOTE — Telephone Encounter (Signed)
Both can schedule VV for rashes and I will be happy to address his shoulder at that time

## 2018-08-28 NOTE — Telephone Encounter (Signed)
Both pt and wife have been scheduled for VV

## 2018-09-03 DIAGNOSIS — M7581 Other shoulder lesions, right shoulder: Secondary | ICD-10-CM | POA: Diagnosis not present

## 2019-01-01 DIAGNOSIS — L821 Other seborrheic keratosis: Secondary | ICD-10-CM | POA: Diagnosis not present

## 2019-01-01 DIAGNOSIS — Z23 Encounter for immunization: Secondary | ICD-10-CM | POA: Diagnosis not present

## 2019-01-01 DIAGNOSIS — D2372 Other benign neoplasm of skin of left lower limb, including hip: Secondary | ICD-10-CM | POA: Diagnosis not present

## 2019-01-01 DIAGNOSIS — L814 Other melanin hyperpigmentation: Secondary | ICD-10-CM | POA: Diagnosis not present

## 2019-04-21 ENCOUNTER — Other Ambulatory Visit: Payer: Self-pay

## 2019-04-23 ENCOUNTER — Other Ambulatory Visit: Payer: Self-pay

## 2019-04-23 ENCOUNTER — Encounter: Payer: Self-pay | Admitting: Family Medicine

## 2019-04-23 ENCOUNTER — Ambulatory Visit (INDEPENDENT_AMBULATORY_CARE_PROVIDER_SITE_OTHER): Payer: BLUE CROSS/BLUE SHIELD | Admitting: Family Medicine

## 2019-04-23 VITALS — BP 120/80 | HR 79 | Temp 97.9°F | Resp 16 | Ht 73.0 in | Wt 193.1 lb

## 2019-04-23 DIAGNOSIS — M25539 Pain in unspecified wrist: Secondary | ICD-10-CM | POA: Diagnosis not present

## 2019-04-23 MED ORDER — MELOXICAM 15 MG PO TABS: 15.0000 mg | ORAL_TABLET | Freq: Every day | ORAL | 0 refills | Status: DC

## 2019-04-23 NOTE — Patient Instructions (Addendum)
Follow up as needed or as scheduled We'll call you with your hand appt START the meloxicam once daily w/ food ICE! Call with any questions or concerns Stay Safe!  Stay Healthy!!
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
## 2019-04-29 DIAGNOSIS — M25532 Pain in left wrist: Secondary | ICD-10-CM | POA: Diagnosis not present

## 2019-05-09 DIAGNOSIS — H10413 Chronic giant papillary conjunctivitis, bilateral: Secondary | ICD-10-CM | POA: Diagnosis not present

## 2019-05-09 DIAGNOSIS — H52203 Unspecified astigmatism, bilateral: Secondary | ICD-10-CM | POA: Diagnosis not present

## 2019-05-09 DIAGNOSIS — H5213 Myopia, bilateral: Secondary | ICD-10-CM | POA: Diagnosis not present

## 2019-07-02 ENCOUNTER — Ambulatory Visit (INDEPENDENT_AMBULATORY_CARE_PROVIDER_SITE_OTHER): Payer: BC Managed Care – PPO | Admitting: Family Medicine

## 2019-07-02 ENCOUNTER — Encounter: Payer: Self-pay | Admitting: Family Medicine

## 2019-07-02 ENCOUNTER — Encounter: Payer: Self-pay | Admitting: General Practice

## 2019-07-02 ENCOUNTER — Other Ambulatory Visit: Payer: Self-pay

## 2019-07-02 VITALS — BP 124/80 | HR 91 | Temp 98.1°F | Resp 16 | Ht 73.0 in | Wt 196.5 lb

## 2019-07-02 DIAGNOSIS — Z0000 Encounter for general adult medical examination without abnormal findings: Secondary | ICD-10-CM | POA: Diagnosis not present

## 2019-07-02 DIAGNOSIS — E663 Overweight: Secondary | ICD-10-CM | POA: Diagnosis not present

## 2019-07-02 DIAGNOSIS — Z125 Encounter for screening for malignant neoplasm of prostate: Secondary | ICD-10-CM

## 2019-07-02 LAB — CBC WITH DIFFERENTIAL/PLATELET
Basophils Absolute: 0 10*3/uL (ref 0.0–0.1)
Basophils Relative: 0.2 % (ref 0.0–3.0)
Eosinophils Absolute: 0.1 10*3/uL (ref 0.0–0.7)
Eosinophils Relative: 1.3 % (ref 0.0–5.0)
HCT: 45.7 % (ref 39.0–52.0)
Hemoglobin: 15.7 g/dL (ref 13.0–17.0)
Lymphocytes Relative: 25.4 % (ref 12.0–46.0)
Lymphs Abs: 2.1 10*3/uL (ref 0.7–4.0)
MCHC: 34.3 g/dL (ref 30.0–36.0)
MCV: 85.4 fl (ref 78.0–100.0)
Monocytes Absolute: 0.5 10*3/uL (ref 0.1–1.0)
Monocytes Relative: 6.4 % (ref 3.0–12.0)
Neutro Abs: 5.5 10*3/uL (ref 1.4–7.7)
Neutrophils Relative %: 66.7 % (ref 43.0–77.0)
Platelets: 248 10*3/uL (ref 150.0–400.0)
RBC: 5.35 Mil/uL (ref 4.22–5.81)
RDW: 13.4 % (ref 11.5–15.5)
WBC: 8.2 10*3/uL (ref 4.0–10.5)

## 2019-07-02 LAB — BASIC METABOLIC PANEL
BUN: 17 mg/dL (ref 6–23)
CO2: 27 mEq/L (ref 19–32)
Calcium: 9.7 mg/dL (ref 8.4–10.5)
Chloride: 104 mEq/L (ref 96–112)
Creatinine, Ser: 1.14 mg/dL (ref 0.40–1.50)
GFR: 66.92 mL/min (ref >60.00)
Glucose, Bld: 81 mg/dL (ref 70–99)
Potassium: 4.7 mEq/L (ref 3.5–5.1)
Sodium: 138 mEq/L (ref 135–145)

## 2019-07-02 LAB — HEPATIC FUNCTION PANEL
ALT: 28 U/L (ref 0–53)
AST: 22 U/L (ref 0–37)
Albumin: 4.5 g/dL (ref 3.5–5.2)
Alkaline Phosphatase: 92 U/L (ref 39–117)
Bilirubin, Direct: 0.1 mg/dL (ref 0.0–0.3)
Total Bilirubin: 0.7 mg/dL (ref 0.2–1.2)
Total Protein: 7 g/dL (ref 6.0–8.3)

## 2019-07-02 LAB — TSH: TSH: 3.01 u[IU]/mL (ref 0.35–4.50)

## 2019-07-02 LAB — LIPID PANEL
Cholesterol: 171 mg/dL (ref 0–200)
HDL: 39.5 mg/dL (ref >39.00)
LDL Cholesterol: 114 mg/dL — ABNORMAL HIGH (ref 0–99)
NonHDL: 131.21
Total CHOL/HDL Ratio: 4
Triglycerides: 85 mg/dL (ref 0.0–149.0)
VLDL: 17 mg/dL (ref 0.0–40.0)

## 2019-07-02 LAB — PSA: PSA: 2.89 ng/mL (ref 0.10–4.00)

## 2019-07-02 NOTE — Assessment & Plan Note (Signed)
Pt's PE WNL.  UTD on Tdap.  Check labs.  Anticipatory guidance provided.  

## 2019-07-02 NOTE — Progress Notes (Signed)
   Subjective:    Patient ID: Gregory Coffey, male    DOB: 03-Oct-1965, 54 y.o.   MRN: [id number]  HPI CPE- UTD on colonoscopy, Tdap.  No concerns today.   Review of Systems Patient reports no vision/hearing changes, anorexia, fever ,adenopathy, persistant/recurrent hoarseness, swallowing issues, chest pain, palpitations, edema, persistant/recurrent cough, hemoptysis, dyspnea (rest,exertional, paroxysmal nocturnal), gastrointestinal  bleeding (melena, rectal bleeding), abdominal pain, excessive heart burn, GU symptoms (dysuria, hematuria, voiding/incontinence issues) syncope, focal weakness, memory loss, numbness & tingling, skin/hair/nail changes, depression, anxiety, abnormal bruising/bleeding, musculoskeletal symptoms/signs.   This visit occurred during the SARS-CoV-2 public health emergency.  Safety protocols were in place, including screening questions prior to the visit, additional usage of staff PPE, and extensive cleaning of exam room while observing appropriate contact time as indicated for disinfecting solutions.       Objective:   Physical Exam General Appearance:    Alert, cooperative, no distress, appears stated age  Head:    Normocephalic, without obvious abnormality, atraumatic  Eyes:    PERRL, conjunctiva/corneas clear, EOM's intact, fundi    benign, both eyes       Ears:    Normal TM's and external ear canals, both ears  Nose:   Deferred due to COVID  Throat:   Neck:   Supple, symmetrical, trachea midline, no adenopathy;       thyroid:  No enlargement/tenderness/nodules  Back:     Symmetric, no curvature, ROM normal, no CVA tenderness  Lungs:     Clear to auscultation bilaterally, respirations unlabored  Chest wall:    No tenderness or deformity  Heart:    Regular rate and rhythm, S1 and S2 normal, no murmur, rub   or gallop  Abdomen:     Soft, non-tender, bowel sounds active all four quadrants,    no masses, no organomegaly  Genitalia:    Deferred  Rectal:      Extremities:   Extremities normal, atraumatic, no cyanosis or edema  Pulses:   2+ and symmetric all extremities  Skin:   Skin color, texture, turgor normal, no rashes or lesions  Lymph nodes:   Cervical, supraclavicular, and axillary nodes normal  Neurologic:   CNII-XII intact. Normal strength, sensation and reflexes      throughout          Assessment & Plan:

## 2019-07-02 NOTE — Patient Instructions (Signed)
Follow up in 1 year or as needed We'll notify you of your lab results and make any changes if needed Continue to work on healthy diet and regular exercise- you can do it! Call with any questions or concerns Stay Safe!  Stay Healthy! 

## 2019-07-17 ENCOUNTER — Telehealth: Payer: Self-pay | Admitting: Family Medicine

## 2019-07-17 NOTE — Telephone Encounter (Signed)
Pt called in stating that his cpe form hasn't been received yet. Please advise. Pt states that he had the form when he came in for his cpe on 07/02/19

## 2019-07-17 NOTE — Telephone Encounter (Signed)
Paperwork completed and given to PCP for signature.  

## 2019-07-18 NOTE — Telephone Encounter (Signed)
Form completed and placed in basket  

## 2019-07-18 NOTE — Telephone Encounter (Signed)
Paperwork faxed °

## 2020-07-06 ENCOUNTER — Encounter: Payer: Self-pay | Admitting: Family Medicine

## 2020-07-06 ENCOUNTER — Ambulatory Visit (INDEPENDENT_AMBULATORY_CARE_PROVIDER_SITE_OTHER): Payer: BC Managed Care – PPO | Admitting: Family Medicine

## 2020-07-06 ENCOUNTER — Other Ambulatory Visit: Payer: Self-pay

## 2020-07-06 VITALS — BP 111/80 | HR 88 | Temp 97.4°F | Resp 18 | Ht 73.0 in | Wt 196.0 lb

## 2020-07-06 DIAGNOSIS — Z0000 Encounter for general adult medical examination without abnormal findings: Secondary | ICD-10-CM

## 2020-07-06 DIAGNOSIS — Z125 Encounter for screening for malignant neoplasm of prostate: Secondary | ICD-10-CM | POA: Diagnosis not present

## 2020-07-06 DIAGNOSIS — E663 Overweight: Secondary | ICD-10-CM | POA: Diagnosis not present

## 2020-07-06 LAB — CBC WITH DIFFERENTIAL/PLATELET
Basophils Absolute: 0 10*3/uL (ref 0.0–0.1)
Basophils Relative: 0.4 % (ref 0.0–3.0)
Eosinophils Absolute: 0.1 10*3/uL (ref 0.0–0.7)
Eosinophils Relative: 2 % (ref 0.0–5.0)
HCT: 46.7 % (ref 39.0–52.0)
Hemoglobin: 16 g/dL (ref 13.0–17.0)
Lymphocytes Relative: 25.9 % (ref 12.0–46.0)
Lymphs Abs: 1.9 10*3/uL (ref 0.7–4.0)
MCHC: 34.2 g/dL (ref 30.0–36.0)
MCV: 84.7 fl (ref 78.0–100.0)
Monocytes Absolute: 0.4 10*3/uL (ref 0.1–1.0)
Monocytes Relative: 5.8 % (ref 3.0–12.0)
Neutro Abs: 4.8 10*3/uL (ref 1.4–7.7)
Neutrophils Relative %: 65.9 % (ref 43.0–77.0)
Platelets: 245 10*3/uL (ref 150.0–400.0)
RBC: 5.51 Mil/uL (ref 4.22–5.81)
RDW: 13.5 % (ref 11.5–15.5)
WBC: 7.3 10*3/uL (ref 4.0–10.5)

## 2020-07-06 LAB — BASIC METABOLIC PANEL
BUN: 17 mg/dL (ref 6–23)
CO2: 28 mEq/L (ref 19–32)
Calcium: 9.6 mg/dL (ref 8.4–10.5)
Chloride: 106 mEq/L (ref 96–112)
Creatinine, Ser: 1.19 mg/dL (ref 0.40–1.50)
GFR: 68.97 mL/min (ref >60.00)
Glucose, Bld: 96 mg/dL (ref 70–99)
Potassium: 4.3 mEq/L (ref 3.5–5.1)
Sodium: 139 mEq/L (ref 135–145)

## 2020-07-06 LAB — HEPATIC FUNCTION PANEL
ALT: 22 U/L (ref 0–53)
AST: 19 U/L (ref 0–37)
Albumin: 4.4 g/dL (ref 3.5–5.2)
Alkaline Phosphatase: 80 U/L (ref 39–117)
Bilirubin, Direct: 0.1 mg/dL (ref 0.0–0.3)
Total Bilirubin: 0.4 mg/dL (ref 0.2–1.2)
Total Protein: 6.9 g/dL (ref 6.0–8.3)

## 2020-07-06 LAB — LIPID PANEL
Cholesterol: 167 mg/dL (ref 0–200)
HDL: 36.1 mg/dL — ABNORMAL LOW (ref >39.00)
LDL Cholesterol: 99 mg/dL (ref 0–99)
NonHDL: 131.33
Total CHOL/HDL Ratio: 5
Triglycerides: 164 mg/dL — ABNORMAL HIGH (ref 0.0–149.0)
VLDL: 32.8 mg/dL (ref 0.0–40.0)

## 2020-07-06 LAB — TSH: TSH: 3.46 u[IU]/mL (ref 0.35–4.50)

## 2020-07-06 LAB — PSA: PSA: 2.8 ng/mL (ref 0.10–4.00)

## 2020-07-06 NOTE — Progress Notes (Signed)
   Subjective:    Patient ID: Gregory Coffey, male    DOB: 1966/03/15, 55 y.o.   MRN: [id number]  HPI CPE- UTD on colonoscopy, Tdap, COVID.  Declines flu  Reviewed past medical, surgical, family and social histories.   Patient Care Team    Relationship Specialty Notifications Start End  Sheliah Hatch, MD PCP - General Family Medicine  03/05/12   Loletha Carrow, MD Consulting Physician Ophthalmology  06/01/15     Health Maintenance  Topic Date Due  . INFLUENZA VACCINE  08/22/2020 (Originally 11/09/2019)  . Hepatitis C Screening  07/06/2021 (Originally 01-22-1966)  . HIV Screening  07/06/2021 (Originally 06/04/1980)  . COLONOSCOPY (Pts 45-37yrs Insurance coverage will need to be confirmed)  02/18/2025  . TETANUS/TDAP  11/17/2026  . COVID-19 Vaccine  Completed  . HPV VACCINES  Aged Out      Review of Systems Patient reports no vision/hearing changes, anorexia, fever ,adenopathy, persistant/recurrent hoarseness, swallowing issues, chest pain, palpitations, edema, persistant/recurrent cough, hemoptysis, dyspnea (rest,exertional, paroxysmal nocturnal), gastrointestinal  bleeding (melena, rectal bleeding), abdominal pain, excessive heart burn, GU symptoms (dysuria, hematuria, voiding/incontinence issues) syncope, focal weakness, memory loss, numbness & tingling, skin/hair/nail changes, depression, anxiety, abnormal bruising/bleeding, musculoskeletal symptoms/signs.   + ptosis of R eye  This visit occurred during the SARS-CoV-2 public health emergency.  Safety protocols were in place, including screening questions prior to the visit, additional usage of staff PPE, and extensive cleaning of exam room while observing appropriate contact time as indicated for disinfecting solutions.       Objective:   Physical Exam General Appearance:    Alert, cooperative, no distress, appears stated age  Head:    Normocephalic, without obvious abnormality, atraumatic  Eyes:    PERRL,  conjunctiva/corneas clear, EOM's intact, fundi    benign, both eyes       Ears:    Normal TM's and external ear canals, both ears  Nose:   Deferred due to COVID  Throat:   Neck:   Supple, symmetrical, trachea midline, no adenopathy;       thyroid:  No enlargement/tenderness/nodules  Back:     Symmetric, no curvature, ROM normal, no CVA tenderness  Lungs:     Clear to auscultation bilaterally, respirations unlabored  Chest wall:    No tenderness or deformity  Heart:    Regular rate and rhythm, S1 and S2 normal, no murmur, rub   or gallop  Abdomen:     Soft, non-tender, bowel sounds active all four quadrants,    no masses, no organomegaly  Genitalia:    deferred  Rectal:    Extremities:   Extremities normal, atraumatic, no cyanosis or edema  Pulses:   2+ and symmetric all extremities  Skin:   Skin color, texture, turgor normal, no rashes or lesions  Lymph nodes:   Cervical, supraclavicular, and axillary nodes normal  Neurologic:   CNII-XII intact. Normal strength, sensation and reflexes      throughout          Assessment & Plan:

## 2020-07-06 NOTE — Assessment & Plan Note (Signed)
Pt's PE WNL.  UTD on colonoscopy, Tdap, COVID vaccines.  Check labs.  Anticipatory guidance provided.

## 2020-07-06 NOTE — Patient Instructions (Signed)
Follow up in 1 year or as needed We'll notify you of your lab results and make any changes if needed Continue to work on healthy diet and regular exercise- you can do it! Call with any questions or concerns Stay Safe!  Stay Healthy! Happy Spring!!! 

## 2020-08-30 ENCOUNTER — Telehealth: Payer: Self-pay | Admitting: Family Medicine

## 2020-08-30 NOTE — Telephone Encounter (Signed)
Date of appt. - 07/06/20 - blood work was not coded as being for the physical - it was coded as him being overweight - please resubmit this.

## 2020-08-30 NOTE — Telephone Encounter (Signed)
I am happy to code it as CPE but typically insurance companies don't cover labs for CPE- they needs a specific diagnosis code.  As far as I'm concerned, labs can be associated w/ both diagnoses

## 2020-08-31 NOTE — Telephone Encounter (Signed)
I have spoke with 2 different agents with [employer].  They can not confirm if they will cover the labs if changed to a Z00.0.  Furthermore they can not find a note from "[facility]".    I have spoken with the patient in regard.  I have informed him what I have found out from the insurance company.    Patient has given me another number to call.  I will try that one tomorrow.

## 2021-03-18 ENCOUNTER — Ambulatory Visit (INDEPENDENT_AMBULATORY_CARE_PROVIDER_SITE_OTHER): Payer: BC Managed Care – PPO | Admitting: Registered Nurse

## 2021-03-18 ENCOUNTER — Encounter: Payer: Self-pay | Admitting: Registered Nurse

## 2021-03-18 VITALS — BP 122/72 | HR 117 | Temp 98.2°F | Resp 17 | Wt 199.2 lb

## 2021-03-18 DIAGNOSIS — J22 Unspecified acute lower respiratory infection: Secondary | ICD-10-CM

## 2021-03-18 MED ORDER — BENZONATATE 200 MG PO CAPS: 200.0000 mg | ORAL_CAPSULE | Freq: Three times a day (TID) | ORAL | 0 refills | Status: DC | PRN

## 2021-03-18 MED ORDER — AZITHROMYCIN 250 MG PO TABS: ORAL_TABLET | ORAL | 0 refills | Status: AC

## 2021-03-18 MED ORDER — PREDNISONE 10 MG (21) PO TBPK
ORAL_TABLET | ORAL | 0 refills | Status: DC
Start: 2021-03-18 — End: 2021-08-05

## 2021-03-18 NOTE — Progress Notes (Signed)
Established Patient Office Visit  Subjective:  Patient ID: Gregory Coffey, male    DOB: 1966-02-26  Age: 55 y.o. MRN: [id number]  CC:  Chief Complaint  Patient presents with   Cough    Cough, some chest congestion and some sinus pressure.     HPI Gregory Coffey presents for congestion  Onset soon after thanksgiving with itch in throat, mild cough Past 3-4 nights has worsened with cough, chest congestion Worse at night. Warm showers help somewhat.  Taken mucinex-dm and halls cough drops with some relief.   No shob, difficulty breathing, nvd, fever, chills, sweats.   No sick contacts.   Past Medical History:  Diagnosis Date   Anxiety     History reviewed. No pertinent surgical history.  Family History  Problem Relation Age of Onset   Stroke Maternal Grandmother     Social History   Socioeconomic History   Marital status: Married    Spouse name: Not on file   Number of children: Not on file   Years of education: Not on file   Highest education level: Not on file  Occupational History   Not on file  Tobacco Use   Smoking status: Never   Smokeless tobacco: Never  Vaping Use   Vaping Use: Never used  Substance and Sexual Activity   Alcohol use: Yes   Drug use: No   Sexual activity: Not on file  Other Topics Concern   Not on file  Social History Narrative   Not on file   Social Determinants of Health   Financial Resource Strain: Not on file  Food Insecurity: Not on file  Transportation Needs: Not on file  Physical Activity: Not on file  Stress: Not on file  Social Connections: Not on file  Intimate Partner Violence: Not on file    Outpatient Medications Prior to Visit  Medication Sig Dispense Refill   cetirizine (ZYRTEC) 10 MG tablet Take 1 tablet (10 mg total) by mouth daily. 30 tablet 11   dextromethorphan-guaiFENesin (MUCINEX DM) 30-600 MG 12hr tablet Take 1 tablet by mouth 2 (two) times daily.     fluticasone (FLONASE) 50 MCG/ACT nasal spray  Place 2 sprays into both nostrils daily. (Patient taking differently: Place 2 sprays into both nostrils daily as needed.) 16 g 6   No facility-administered medications prior to visit.    Allergies  Allergen Reactions   Penicillins Hives    ROS Review of Systems  Constitutional: Negative.   HENT:  Positive for congestion and sore throat.   Eyes: Negative.   Respiratory:  Positive for cough and chest tightness.   Cardiovascular: Negative.   Gastrointestinal: Negative.   Genitourinary: Negative.   Musculoskeletal: Negative.   Skin: Negative.   Neurological: Negative.   Psychiatric/Behavioral: Negative.    All other systems reviewed and are negative.    Objective:    Physical Exam Constitutional:      General: He is not in acute distress.    Appearance: Normal appearance. He is normal weight. He is not ill-appearing, toxic-appearing or diaphoretic.  Cardiovascular:     Rate and Rhythm: Normal rate and regular rhythm.     Heart sounds: Normal heart sounds. No murmur heard.   No friction rub. No gallop.  Pulmonary:     Effort: Pulmonary effort is normal. No respiratory distress.     Breath sounds: No stridor. Wheezing present. No rhonchi or rales.  Chest:     Chest wall: No tenderness.  Neurological:  General: No focal deficit present.     Mental Status: He is alert and oriented to person, place, and time. Mental status is at baseline.  Psychiatric:        Mood and Affect: Mood normal.        Behavior: Behavior normal.        Thought Content: Thought content normal.        Judgment: Judgment normal.    BP 122/72   Pulse (!) 117   Temp 98.2 F (36.8 C)   Resp 17   Wt 199 lb 3.2 oz (90.4 kg)   SpO2 97%   BMI 26.28 kg/m  Wt Readings from Last 3 Encounters:  03/18/21 199 lb 3.2 oz (90.4 kg)  07/06/20 196 lb (88.9 kg)  07/02/19 196 lb 8 oz (89.1 kg)     Health Maintenance Due  Topic Date Due   Pneumococcal Vaccine 28-77 Years old (1 - PCV) Never done    Zoster Vaccines- Shingrix (1 of 2) Never done   COVID-19 Vaccine (4 - Booster for Pfizer series) 05/07/2020   INFLUENZA VACCINE  11/08/2020    There are no preventive care reminders to display for this patient.  Lab Results  Component Value Date   TSH 3.46 07/06/2020   Lab Results  Component Value Date   WBC 7.3 07/06/2020   HGB 16.0 07/06/2020   HCT 46.7 07/06/2020   MCV 84.7 07/06/2020   PLT 245.0 07/06/2020   Lab Results  Component Value Date   NA 139 07/06/2020   K 4.3 07/06/2020   CO2 28 07/06/2020   GLUCOSE 96 07/06/2020   BUN 17 07/06/2020   CREATININE 1.19 07/06/2020   BILITOT 0.4 07/06/2020   ALKPHOS 80 07/06/2020   AST 19 07/06/2020   ALT 22 07/06/2020   PROT 6.9 07/06/2020   ALBUMIN 4.4 07/06/2020   CALCIUM 9.6 07/06/2020   GFR 68.97 07/06/2020   Lab Results  Component Value Date   CHOL 167 07/06/2020   Lab Results  Component Value Date   HDL 36.10 (L) 07/06/2020   Lab Results  Component Value Date   LDLCALC 99 07/06/2020   Lab Results  Component Value Date   TRIG 164.0 (H) 07/06/2020   Lab Results  Component Value Date   CHOLHDL 5 07/06/2020   No results found for: HGBA1C    Assessment & Plan:   Problem List Items Addressed This Visit   None Visit Diagnoses     Lower respiratory infection    -  Primary   Relevant Medications   azithromycin (ZITHROMAX) 250 MG tablet   predniSONE (STERAPRED UNI-PAK 21 TAB) 10 MG (21) TBPK tablet   benzonatate (TESSALON) 200 MG capsule       Meds ordered this encounter  Medications   azithromycin (ZITHROMAX) 250 MG tablet    Sig: Take 2 tablets on day 1, then 1 tablet daily on days 2 through 5    Dispense:  6 tablet    Refill:  0    Order Specific Question:   Supervising Provider    Answer:   Neva Seat, JEFFREY R [[id number]]   predniSONE (STERAPRED UNI-PAK 21 TAB) 10 MG (21) TBPK tablet    Sig: Take per package instructions. Do not skip doses. Finish entire supply.    Dispense:  1 each    Refill:   0    Order Specific Question:   Supervising Provider    Answer:   Neva Seat, JEFFREY R [[id number]]   benzonatate (TESSALON)  200 MG capsule    Sig: Take 1 capsule (200 mg total) by mouth 3 (three) times daily as needed for cough.    Dispense:  20 capsule    Refill:  0    Order Specific Question:   Supervising Provider    Answer:   Neva Seat, JEFFREY R [[id number]]    Follow-up: Return if symptoms worsen or fail to improve.   PLAN Concern for developing lower respiratory infection - tx with z pack, prednisone, and tessalon Nonpharm supportive care with rest and hydration discussed Reviewed return precautions Patient encouraged to call clinic with any questions, comments, or concerns.  Janeece Agee, NP

## 2021-03-18 NOTE — Patient Instructions (Signed)
Mr. Walkup -   Randie Heinz to see you. Sorry you're not feeling well  Z pack and prednisone for infection - finish entire course.  Can use benzonatate three times a day as needed for relief from cough  Ok to continue mucinex.   Call if worsening or failing to improve.    Thank you  Rich

## 2021-07-07 ENCOUNTER — Encounter: Payer: BC Managed Care – PPO | Admitting: Family Medicine

## 2021-08-05 ENCOUNTER — Ambulatory Visit (INDEPENDENT_AMBULATORY_CARE_PROVIDER_SITE_OTHER): Payer: BC Managed Care – PPO | Admitting: Family Medicine

## 2021-08-05 ENCOUNTER — Encounter: Payer: Self-pay | Admitting: Family Medicine

## 2021-08-05 VITALS — BP 123/79 | HR 84 | Temp 97.7°F | Resp 17 | Ht 73.0 in | Wt 196.0 lb

## 2021-08-05 DIAGNOSIS — Z114 Encounter for screening for human immunodeficiency virus [HIV]: Secondary | ICD-10-CM

## 2021-08-05 DIAGNOSIS — E663 Overweight: Secondary | ICD-10-CM | POA: Diagnosis not present

## 2021-08-05 DIAGNOSIS — Z1159 Encounter for screening for other viral diseases: Secondary | ICD-10-CM

## 2021-08-05 DIAGNOSIS — Z0000 Encounter for general adult medical examination without abnormal findings: Secondary | ICD-10-CM | POA: Diagnosis not present

## 2021-08-05 DIAGNOSIS — Z125 Encounter for screening for malignant neoplasm of prostate: Secondary | ICD-10-CM

## 2021-08-05 LAB — CBC WITH DIFFERENTIAL/PLATELET
Basophils Absolute: 0 10*3/uL (ref 0.0–0.1)
Basophils Relative: 0.3 % (ref 0.0–3.0)
Eosinophils Absolute: 0.1 10*3/uL (ref 0.0–0.7)
Eosinophils Relative: 1.1 % (ref 0.0–5.0)
HCT: 48.3 % (ref 39.0–52.0)
Hemoglobin: 16.2 g/dL (ref 13.0–17.0)
Lymphocytes Relative: 18.9 % (ref 12.0–46.0)
Lymphs Abs: 1.8 10*3/uL (ref 0.7–4.0)
MCHC: 33.6 g/dL (ref 30.0–36.0)
MCV: 86.6 fl (ref 78.0–100.0)
Monocytes Absolute: 0.5 10*3/uL (ref 0.1–1.0)
Monocytes Relative: 5.1 % (ref 3.0–12.0)
Neutro Abs: 7.2 10*3/uL (ref 1.4–7.7)
Neutrophils Relative %: 74.6 % (ref 43.0–77.0)
Platelets: 270 10*3/uL (ref 150.0–400.0)
RBC: 5.58 Mil/uL (ref 4.22–5.81)
RDW: 13.5 % (ref 11.5–15.5)
WBC: 9.6 10*3/uL (ref 4.0–10.5)

## 2021-08-05 LAB — LIPID PANEL
Cholesterol: 186 mg/dL (ref 0–200)
HDL: 50 mg/dL (ref >39.00)
LDL Cholesterol: 115 mg/dL — ABNORMAL HIGH (ref 0–99)
NonHDL: 135.62
Total CHOL/HDL Ratio: 4
Triglycerides: 105 mg/dL (ref 0.0–149.0)
VLDL: 21 mg/dL (ref 0.0–40.0)

## 2021-08-05 LAB — PSA: PSA: 3.88 ng/mL (ref 0.10–4.00)

## 2021-08-05 LAB — HEPATIC FUNCTION PANEL
ALT: 30 U/L (ref 0–53)
AST: 23 U/L (ref 0–37)
Albumin: 4.7 g/dL (ref 3.5–5.2)
Alkaline Phosphatase: 87 U/L (ref 39–117)
Bilirubin, Direct: 0.1 mg/dL (ref 0.0–0.3)
Total Bilirubin: 0.7 mg/dL (ref 0.2–1.2)
Total Protein: 7.2 g/dL (ref 6.0–8.3)

## 2021-08-05 LAB — BASIC METABOLIC PANEL
BUN: 19 mg/dL (ref 6–23)
CO2: 27 mEq/L (ref 19–32)
Calcium: 10.1 mg/dL (ref 8.4–10.5)
Chloride: 104 mEq/L (ref 96–112)
Creatinine, Ser: 1.18 mg/dL (ref 0.40–1.50)
GFR: 69.14 mL/min (ref >60.00)
Glucose, Bld: 79 mg/dL (ref 70–99)
Potassium: 4.1 mEq/L (ref 3.5–5.1)
Sodium: 139 mEq/L (ref 135–145)

## 2021-08-05 LAB — TSH: TSH: 3.16 u[IU]/mL (ref 0.35–5.50)

## 2021-08-05 NOTE — Progress Notes (Signed)
? ?  Subjective:  ? ? Patient ID: Gregory Coffey, male    DOB: 05-Feb-1966, 56 y.o.   MRN: [id number] ? ?HPI ?CPE- UTD on colonoscopy, Tdap.  No concerns today ? ?Patient Care Team  ?  Relationship Specialty Notifications Start End  ?Sheliah Hatch, MD PCP - General Family Medicine  03/05/12   ?Loletha Carrow, MD Consulting Physician Ophthalmology  06/01/15   ?  ?Health Maintenance  ?Topic Date Due  ? HIV Screening  Never done  ? Hepatitis C Screening  Never done  ? COVID-19 Vaccine (4 - Booster for Pfizer series) 08/21/2021 (Originally 05/07/2020)  ? Zoster Vaccines- Shingrix (1 of 2) 11/04/2021 (Originally 06/04/1984)  ? INFLUENZA VACCINE  11/08/2021  ? COLONOSCOPY (Pts 45-75yrs Insurance coverage will need to be confirmed)  02/18/2025  ? TETANUS/TDAP  11/17/2026  ? HPV VACCINES  Aged Out  ?  ? ? ?Review of Systems ?Patient reports no vision/hearing changes, anorexia, fever ,adenopathy, persistant/recurrent hoarseness, swallowing issues, chest pain, palpitations, edema, persistant/recurrent cough, hemoptysis, dyspnea (rest,exertional, paroxysmal nocturnal), gastrointestinal  bleeding (melena, rectal bleeding), abdominal pain, excessive heart burn, GU symptoms (dysuria, hematuria, voiding/incontinence issues) syncope, focal weakness, memory loss, numbness & tingling, skin/hair/nail changes, depression, anxiety, abnormal bruising/bleeding, musculoskeletal symptoms/signs.  ?   ?Objective:  ? Physical Exam ?General Appearance:    Alert, cooperative, no distress, appears stated age  ?Head:    Normocephalic, without obvious abnormality, atraumatic  ?Eyes:    PERRL, conjunctiva/corneas clear, EOM's intact both eyes       ?Ears:    Normal TM's and external ear canals, both ears  ?Nose:   Nares normal, septum midline, mucosa normal, no drainage ?  or sinus tenderness  ?Throat:   Lips, mucosa, and tongue normal; teeth and gums normal  ?Neck:   Supple, symmetrical, trachea midline, no adenopathy;     ?  thyroid:  No  enlargement/tenderness/nodules  ?Back:     Symmetric, no curvature, ROM normal, no CVA tenderness  ?Lungs:     Clear to auscultation bilaterally, respirations unlabored  ?Chest wall:    No tenderness or deformity  ?Heart:    Regular rate and rhythm, S1 and S2 normal, no murmur, rub ?  or gallop  ?Abdomen:     Soft, non-tender, bowel sounds active all four quadrants,  ?  no masses, no organomegaly  ?Genitalia:      ?Rectal:    ?Extremities:   Extremities normal, atraumatic, no cyanosis or edema  ?Pulses:   2+ and symmetric all extremities  ?Skin:   Skin color, texture, turgor normal, no rashes or lesions  ?Lymph nodes:   Cervical, supraclavicular, and axillary nodes normal  ?Neurologic:   CNII-XII intact. Normal strength, sensation and reflexes    ?  throughout  ?  ? ? ? ?   ?Assessment & Plan:  ? ? ?

## 2021-08-05 NOTE — Assessment & Plan Note (Signed)
Pt's PE WNL.  UTD on colonoscopy, Tdap.  Check labs.  Anticipatory guidance provided.  

## 2021-08-05 NOTE — Patient Instructions (Signed)
Follow up in 1 year or as needed We'll notify you of your lab results and make any changes if needed Keep up the good work on healthy diet and regular exercise- you look great!!! Call with any questions or concerns Stay Safe!  Stay Healthy! Happy Spring!!! 

## 2021-08-07 LAB — HEPATITIS C ANTIBODY
Hepatitis C Ab: NONREACTIVE
SIGNAL TO CUT-OFF: 0.12 (ref <1.00)

## 2021-08-07 LAB — HIV ANTIBODY (ROUTINE TESTING W REFLEX): HIV 1&2 Ab, 4th Generation: NONREACTIVE

## 2021-08-08 ENCOUNTER — Telehealth: Payer: Self-pay

## 2021-08-08 NOTE — Telephone Encounter (Signed)
-----   Message from Sheliah Hatch, MD sent at 08/07/2021  8:37 PM EDT ----- ?Labs look good!  No changes at this time ?

## 2021-08-08 NOTE — Telephone Encounter (Signed)
Patient aware of labs.  

## 2022-08-09 ENCOUNTER — Encounter: Payer: BC Managed Care – PPO | Admitting: Family Medicine

## 2022-08-31 ENCOUNTER — Telehealth: Payer: Self-pay

## 2022-08-31 ENCOUNTER — Encounter: Payer: Self-pay | Admitting: Family Medicine

## 2022-08-31 ENCOUNTER — Other Ambulatory Visit: Payer: Self-pay

## 2022-08-31 ENCOUNTER — Ambulatory Visit (INDEPENDENT_AMBULATORY_CARE_PROVIDER_SITE_OTHER): Payer: BC Managed Care – PPO | Admitting: Family Medicine

## 2022-08-31 VITALS — BP 108/68 | HR 97 | Temp 97.8°F | Resp 18 | Ht 72.0 in | Wt 192.5 lb

## 2022-08-31 DIAGNOSIS — E663 Overweight: Secondary | ICD-10-CM

## 2022-08-31 DIAGNOSIS — Z125 Encounter for screening for malignant neoplasm of prostate: Secondary | ICD-10-CM | POA: Diagnosis not present

## 2022-08-31 DIAGNOSIS — Z0000 Encounter for general adult medical examination without abnormal findings: Secondary | ICD-10-CM | POA: Diagnosis not present

## 2022-08-31 DIAGNOSIS — R972 Elevated prostate specific antigen [PSA]: Secondary | ICD-10-CM

## 2022-08-31 LAB — HEPATIC FUNCTION PANEL
ALT: 21 U/L (ref 0–53)
AST: 20 U/L (ref 0–37)
Albumin: 4.2 g/dL (ref 3.5–5.2)
Alkaline Phosphatase: 75 U/L (ref 39–117)
Bilirubin, Direct: 0.2 mg/dL (ref 0.0–0.3)
Total Bilirubin: 0.9 mg/dL (ref 0.2–1.2)
Total Protein: 6.6 g/dL (ref 6.0–8.3)

## 2022-08-31 LAB — CBC WITH DIFFERENTIAL/PLATELET
Basophils Absolute: 0 10*3/uL (ref 0.0–0.1)
Basophils Relative: 0.3 % (ref 0.0–3.0)
Eosinophils Absolute: 0.1 10*3/uL (ref 0.0–0.7)
Eosinophils Relative: 1.8 % (ref 0.0–5.0)
HCT: 46.6 % (ref 39.0–52.0)
Hemoglobin: 15.5 g/dL (ref 13.0–17.0)
Lymphocytes Relative: 23.8 % (ref 12.0–46.0)
Lymphs Abs: 1.8 10*3/uL (ref 0.7–4.0)
MCHC: 33.2 g/dL (ref 30.0–36.0)
MCV: 87 fl (ref 78.0–100.0)
Monocytes Absolute: 0.4 10*3/uL (ref 0.1–1.0)
Monocytes Relative: 5.7 % (ref 3.0–12.0)
Neutro Abs: 5.3 10*3/uL (ref 1.4–7.7)
Neutrophils Relative %: 68.4 % (ref 43.0–77.0)
Platelets: 267 10*3/uL (ref 150.0–400.0)
RBC: 5.36 Mil/uL (ref 4.22–5.81)
RDW: 13.4 % (ref 11.5–15.5)
WBC: 7.7 10*3/uL (ref 4.0–10.5)

## 2022-08-31 LAB — LIPID PANEL
Cholesterol: 163 mg/dL (ref 0–200)
HDL: 42.9 mg/dL (ref >39.00)
LDL Cholesterol: 97 mg/dL (ref 0–99)
NonHDL: 120.35
Total CHOL/HDL Ratio: 4
Triglycerides: 117 mg/dL (ref 0.0–149.0)
VLDL: 23.4 mg/dL (ref 0.0–40.0)

## 2022-08-31 LAB — BASIC METABOLIC PANEL
BUN: 19 mg/dL (ref 6–23)
CO2: 28 mEq/L (ref 19–32)
Calcium: 9.6 mg/dL (ref 8.4–10.5)
Chloride: 105 mEq/L (ref 96–112)
Creatinine, Ser: 1.23 mg/dL (ref 0.40–1.50)
GFR: 65.29 mL/min (ref >60.00)
Glucose, Bld: 91 mg/dL (ref 70–99)
Potassium: 4.5 mEq/L (ref 3.5–5.1)
Sodium: 138 mEq/L (ref 135–145)

## 2022-08-31 LAB — PSA: PSA: 5.02 ng/mL — ABNORMAL HIGH (ref 0.10–4.00)

## 2022-08-31 LAB — TSH: TSH: 3.44 u[IU]/mL (ref 0.35–5.50)

## 2022-08-31 NOTE — Assessment & Plan Note (Signed)
Pt's PE WNL.  UTD on colonoscopy, Tdap.  Declines shingles.  Check labs.  Anticipatory guidance provided.

## 2022-08-31 NOTE — Telephone Encounter (Signed)
-----   Message from Sheliah Hatch, MD sent at 08/31/2022  3:35 PM EDT ----- Labs look great w/ exception of PSA that has risen to 5.02.  Based on this, we will refer to urology for a complete evaluation (dx elevated PSA)

## 2022-08-31 NOTE — Progress Notes (Signed)
   Subjective:    Patient ID: Gregory Coffey, male    DOB: 21-Oct-1965, 57 y.o.   MRN: [id number]  HPI CPE- UTD on colonoscopy, Tdap.  No concerns today  Patient Care Team    Relationship Specialty Notifications Start End  Sheliah Hatch, MD PCP - General Family Medicine  03/05/12   Loletha Carrow, MD Consulting Physician Ophthalmology  06/01/15     Health Maintenance  Topic Date Due   Zoster Vaccines- Shingrix (1 of 2) 12/01/2022 (Originally 06/04/1984)   INFLUENZA VACCINE  11/09/2022   COLONOSCOPY (Pts 45-45yrs Insurance coverage will need to be confirmed)  02/18/2025   DTaP/Tdap/Td (2 - Td or Tdap) 11/17/2026   Hepatitis C Screening  Completed   HIV Screening  Completed   HPV VACCINES  Aged Out   COVID-19 Vaccine  Discontinued      Review of Systems Patient reports no vision/hearing changes, anorexia, fever ,adenopathy, persistant/recurrent hoarseness, swallowing issues, chest pain, palpitations, edema, persistant/recurrent cough, hemoptysis, dyspnea (rest,exertional, paroxysmal nocturnal), gastrointestinal  bleeding (melena, rectal bleeding), abdominal pain, excessive heart burn, GU symptoms (dysuria, hematuria, voiding/incontinence issues) syncope, focal weakness, memory loss, numbness & tingling, skin/hair/nail changes, depression, anxiety, abnormal bruising/bleeding, musculoskeletal symptoms/signs.     Objective:   Physical Exam General Appearance:    Alert, cooperative, no distress, appears stated age  Head:    Normocephalic, without obvious abnormality, atraumatic  Eyes:    PERRL, conjunctiva/corneas clear, EOM's intact both eyes       Ears:    Normal TM's and external ear canals, both ears  Nose:   Nares normal, septum midline, mucosa normal, no drainage   or sinus tenderness  Throat:   Lips, mucosa, and tongue normal; teeth and gums normal  Neck:   Supple, symmetrical, trachea midline, no adenopathy;       thyroid:  No enlargement/tenderness/nodules  Back:      Symmetric, no curvature, ROM normal, no CVA tenderness  Lungs:     Clear to auscultation bilaterally, respirations unlabored  Chest wall:    No tenderness or deformity  Heart:    Regular rate and rhythm, S1 and S2 normal, no murmur, rub   or gallop  Abdomen:     Soft, non-tender, bowel sounds active all four quadrants,    no masses, no organomegaly  Genitalia:    deferred  Rectal:    Extremities:   Extremities normal, atraumatic, no cyanosis or edema  Pulses:   2+ and symmetric all extremities  Skin:   Skin color, texture, turgor normal, no rashes or lesions  Lymph nodes:   Cervical, supraclavicular, and axillary nodes normal  Neurologic:   CNII-XII intact. Normal strength, sensation and reflexes      throughout          Assessment & Plan:

## 2022-08-31 NOTE — Telephone Encounter (Signed)
Left pt a VM stating lab results and referral to Urology has been placed

## 2022-08-31 NOTE — Patient Instructions (Signed)
Follow up in 1 year or as needed We'll notify you of your lab results and make any changes if needed Keep up the good work on healthy diet and regular exercise- you look great! Call with any questions or concerns Stay Safe!  Stay Healthy! ENJOY THE [place]!!

## 2022-09-06 ENCOUNTER — Telehealth: Payer: Self-pay

## 2022-09-06 NOTE — Telephone Encounter (Signed)
Form completed and returned to [facility] 

## 2022-09-06 NOTE — Telephone Encounter (Signed)
Form faxed and placed in scan  

## 2022-09-06 NOTE — Telephone Encounter (Signed)
Phys results form placed in Dr Beverely Low to be signed folder

## 2022-09-13 ENCOUNTER — Encounter: Payer: BC Managed Care – PPO | Admitting: Urology

## 2022-09-19 ENCOUNTER — Ambulatory Visit (INDEPENDENT_AMBULATORY_CARE_PROVIDER_SITE_OTHER): Payer: BC Managed Care – PPO | Admitting: Urology

## 2022-09-19 ENCOUNTER — Encounter: Payer: Self-pay | Admitting: Urology

## 2022-09-19 VITALS — BP 116/78 | HR 87 | Ht 73.0 in | Wt 190.0 lb

## 2022-09-19 DIAGNOSIS — R972 Elevated prostate specific antigen [PSA]: Secondary | ICD-10-CM

## 2022-09-19 LAB — URINALYSIS, ROUTINE W REFLEX MICROSCOPIC
Bilirubin, UA: NEGATIVE
Glucose, UA: NEGATIVE
Ketones, UA: NEGATIVE
Leukocytes,UA: NEGATIVE
Nitrite, UA: NEGATIVE
Protein,UA: NEGATIVE
Specific Gravity, UA: 1.025 (ref 1.005–1.030)
Urobilinogen, Ur: 0.2 mg/dL (ref 0.2–1.0)
pH, UA: 7 (ref 5.0–7.5)

## 2022-09-19 LAB — MICROSCOPIC EXAMINATION
Bacteria, UA: NONE SEEN
Cast Type: NONE SEEN
Casts: NONE SEEN /lpf
Crystal Type: NONE SEEN
Crystals: NONE SEEN
Epithelial Cells (non renal): NONE SEEN /hpf (ref 0–10)
Renal Epithel, UA: NONE SEEN /hpf
Trichomonas, UA: NONE SEEN
WBC, UA: NONE SEEN /hpf (ref 0–5)
Yeast, UA: NONE SEEN

## 2022-09-19 NOTE — Progress Notes (Addendum)
Assessment: 1. Elevated PSA      Plan: Today I had a long and detailed discussion with the patient regarding his elevated PSA and concern for prostate cancer along with the controversies regarding prostate cancer early detection.  We discussed options for further evaluation including prostate imaging and biopsy.  The patient numerous questions today which I answered. Following our discussion, patient elects to proceed with a multiparametric prostate MRI which could hopefully provide targeting information for subsequent biopsy.  I emphasized to him that I did not think that the MRI would obviate the ultimate need for him to have a biopsy even if it did not show any high-grade lesions.  He understands this. Iso-PSA today Schedule mp-prostate mri FU after mri (patient will call us once the MRI is scheduled and arrange follow-up 7 to 10 days later)    ADDENDUM 09/27/22--I called the patient today at his request and reviewed the results of his repeat PSA and ISO-PSA (SEE BELOW).  Patient has MRI prostate scheduled for 7/25 and I will see back week or 2 later.  Chief Complaint: Elevated PSA  History of Present Illness:  Gregory Coffey is a 57 y.o. male who is seen in consultation from Sheliah Hatch, MD for evaluation of elevated psa. Patient otherwise very healthy without significant medical problems who has had a rising PSA over the last few years-see PSA data below There is no family history of prostate cancer Patient does have mild ED but does not use any medications.  He has trouble maintaining an erection at times. Patient denies significant lower urinary tract symptoms.  IPSS = 1  PSA data: 05/2015  1.80 06/2019  2.89 06/2020  2.80 07/2021  3.88 08/2022  5.02 09/2022  5.97 Iso-PSA ELEVATED @ 14.2   Past Medical History:  Past Medical History:  Diagnosis Date   Anxiety     Past Surgical History:  History reviewed. No pertinent surgical history.  Allergies:  Allergies   Allergen Reactions   Penicillins Hives    Family History:  Family History  Problem Relation Age of Onset   Stroke Maternal Grandmother     Social History:  Social History   Tobacco Use   Smoking status: Never   Smokeless tobacco: Never  Vaping Use   Vaping Use: Never used  Substance Use Topics   Alcohol use: Yes   Drug use: No    Review of symptoms:  Constitutional:  Negative for unexplained weight loss, night sweats, fever, chills ENT:  Negative for nose bleeds, sinus pain, painful swallowing CV:  Negative for chest pain, shortness of breath, exercise intolerance, palpitations, loss of consciousness Resp:  Negative for cough, wheezing, shortness of breath GI:  Negative for nausea, vomiting, diarrhea, bloody stools GU:  Positives noted in HPI; otherwise negative for gross hematuria, dysuria, urinary incontinence Neuro:  Negative for seizures, poor balance, limb weakness, slurred speech Psych:  Negative for lack of energy, depression, anxiety Endocrine:  Negative for polydipsia, polyuria, symptoms of hypoglycemia (dizziness, hunger, sweating) Hematologic:  Negative for anemia, purpura, petechia, prolonged or excessive bleeding, use of anticoagulants  Allergic:  Negative for difficulty breathing or choking as a result of exposure to anything; no shellfish allergy; no allergic response (rash/itch) to materials, foods  Physical exam: BP 116/78   Pulse 87   Ht 6\' 1"  (1.854 m)   Wt 190 lb (86.2 kg)   BMI 25.07 kg/m  GENERAL APPEARANCE:  Well appearing, well developed, well nourished, NAD  GU: Normal external  genitalia DRE: Normal sphincter tone; patient tolerates DRE very poorly.  Prostate however is approximately 30 to 40 g without evidence of nodules or induration  Results: UA: negative

## 2022-09-20 ENCOUNTER — Encounter: Payer: BC Managed Care – PPO | Admitting: Urology

## 2022-09-22 ENCOUNTER — Encounter: Payer: Self-pay | Admitting: Urology

## 2022-09-27 ENCOUNTER — Telehealth: Payer: Self-pay | Admitting: Urology

## 2022-09-27 NOTE — Telephone Encounter (Signed)
[phone] Would like a call back regarding last weeks results.

## 2022-11-02 ENCOUNTER — Ambulatory Visit
Admission: RE | Admit: 2022-11-02 | Discharge: 2022-11-02 | Disposition: A | Discharge status: Home / Self Care | Payer: BC Managed Care – PPO | Source: Ambulatory Visit | Attending: Urology | Admitting: Urology

## 2022-11-02 DIAGNOSIS — R972 Elevated prostate specific antigen [PSA]: Secondary | ICD-10-CM

## 2022-11-02 MED ORDER — GADOPICLENOL 0.5 MMOL/ML IV SOLN
9.0000 mL | Freq: Once | INTRAVENOUS | Status: AC | PRN
  Administered 2022-11-02: 9 mL via INTRAVENOUS

## 2022-11-09 ENCOUNTER — Telehealth: Payer: Self-pay | Admitting: Family Medicine

## 2022-11-09 NOTE — Telephone Encounter (Signed)
Dropped off Physical Forms to be completed  Charge sheet attached and placed in bin.

## 2022-11-09 NOTE — Telephone Encounter (Signed)
Forms placed in Dr Beverely Low to be signed folder and there is no charge sheet. It is a work Psychiatrist form

## 2022-11-10 NOTE — Telephone Encounter (Signed)
Yes sorry no charge sheet attached

## 2022-11-16 ENCOUNTER — Encounter: Payer: Self-pay | Admitting: Urology

## 2022-11-16 ENCOUNTER — Ambulatory Visit (INDEPENDENT_AMBULATORY_CARE_PROVIDER_SITE_OTHER): Payer: BC Managed Care – PPO | Admitting: Urology

## 2022-11-16 VITALS — BP 123/83 | HR 96

## 2022-11-16 DIAGNOSIS — R972 Elevated prostate specific antigen [PSA]: Secondary | ICD-10-CM | POA: Diagnosis not present

## 2022-11-16 MED ORDER — ALPRAZOLAM 1 MG PO TABS
1.0000 mg | ORAL_TABLET | Freq: Once | ORAL | 0 refills | Status: DC
Start: 2022-11-16 — End: 2022-12-12

## 2022-11-16 NOTE — Telephone Encounter (Signed)
Form completed and returned to [facility] 

## 2022-11-16 NOTE — Progress Notes (Signed)
   Assessment: 1. Elevated PSA     Plan: Today I had a long/exhaustive discussion with the patient and his wife regarding his elevated PSA along with the issues and controversies regarding prostate cancer early detection.  I reviewed the results of his multiparametric prostate MRI which does not show evidence for high grade lesions.  I discussed with him however that this does not obviate the recommendation to proceed with systematic biopsy.  I discussed in detail transrectal ultrasound prostate biopsy today including potential adverse events and complications especially risk for bleeding and infection and need for preprocedural antibiotics.  The patient has an aversion to needles and medical interventions and is not sure he can do this in the office setting.  We discussed the option of oral sedation and an office procedure as well as the potential for setting it up at the surgical center under IV sedation.  Following our discussion he would like to try to do this in the office. Rx: Xanax 1 mg 2 hours prior to the procedure. Schedule next available   Total time spent providing care during visit today=   Chief Complaint: Elevated psa  HPI: Gregory Coffey is a 57 y.o. male who presents for continued evaluation of elevated psa.  Patient otherwise very healthy without significant medical problems who has had a rising PSA over the last few years-see PSA data below There is no family history of prostate cancer Patient does have mild ED but does not use any medications.  He has trouble maintaining an erection at times. Patient denies significant lower urinary tract symptoms.  IPSS = 1  DRE:  30-40gm without n/i   PSA data: 05/2015             1.80 06/2019             2.89 06/2020             2.80 07/2021             3.88 08/2022             5.02 09/2022             5.97     Iso-PSA ELEVATED @ 14.2 10/2022  multiparametric prostate MRI shows no evidence of high-grade prostate    Cancer.   Prostate volume = 42 mL    Portions of the above documentation were copied from a prior visit for review purposes only.  Allergies: Allergies  Allergen Reactions   Penicillins Hives    PMH: Past Medical History:  Diagnosis Date   Anxiety     PSH: No past surgical history on file.  SH: Social History   Tobacco Use   Smoking status: Never   Smokeless tobacco: Never  Vaping Use   Vaping status: Never Used  Substance Use Topics   Alcohol use: Yes   Drug use: No    ROS: Constitutional:  Negative for fever, chills, weight loss CV: Negative for chest pain, previous MI, hypertension Respiratory:  Negative for shortness of breath, wheezing, sleep apnea, frequent cough GI:  Negative for nausea, vomiting, bloody stool, GERD  PE: BP 123/83   Pulse 96  GENERAL APPEARANCE:  Well appearing, well developed, well nourished, NAD    Results: UA neg

## 2022-12-12 ENCOUNTER — Telehealth: Payer: Self-pay | Admitting: Urology

## 2022-12-12 MED ORDER — ALPRAZOLAM 1 MG PO TABS
1.0000 mg | ORAL_TABLET | Freq: Once | ORAL | 0 refills | Status: AC
Start: 2022-12-12 — End: 2022-12-12

## 2022-12-12 NOTE — Telephone Encounter (Signed)
Spoke with patient, he stated that you discussed having a xanax called in to help with his anxiety about the procedure tomorrow. He is aware that he will need a driver.   [facility] - [address] [place]

## 2022-12-12 NOTE — Telephone Encounter (Signed)
Would like to know when a script for a pill would be called in for the pt before appt tomorrow.

## 2022-12-12 NOTE — Addendum Note (Signed)
Addended by: Marcha Solders C on: 12/12/2022 10:42 AM   Modules accepted: Orders

## 2022-12-12 NOTE — Addendum Note (Signed)
Addended by: Joline Maxcy on: 12/12/2022 10:43 AM   Modules accepted: Orders

## 2022-12-13 ENCOUNTER — Ambulatory Visit (HOSPITAL_BASED_OUTPATIENT_CLINIC_OR_DEPARTMENT_OTHER): Payer: BC Managed Care – PPO

## 2022-12-13 ENCOUNTER — Other Ambulatory Visit: Payer: BC Managed Care – PPO | Admitting: Urology

## 2023-03-14 ENCOUNTER — Other Ambulatory Visit: Payer: BC Managed Care – PPO

## 2023-03-20 ENCOUNTER — Ambulatory Visit: Payer: BC Managed Care – PPO | Admitting: Urology

## 2023-04-16 ENCOUNTER — Ambulatory Visit (INDEPENDENT_AMBULATORY_CARE_PROVIDER_SITE_OTHER): Payer: BC Managed Care – PPO

## 2023-04-16 ENCOUNTER — Ambulatory Visit (INDEPENDENT_AMBULATORY_CARE_PROVIDER_SITE_OTHER): Payer: BC Managed Care – PPO | Admitting: Internal Medicine

## 2023-04-16 VITALS — BP 118/80 | HR 109 | Temp 98.4°F | Ht 73.0 in | Wt 187.0 lb

## 2023-04-16 DIAGNOSIS — R059 Cough, unspecified: Secondary | ICD-10-CM

## 2023-04-16 DIAGNOSIS — U071 COVID-19: Secondary | ICD-10-CM | POA: Diagnosis not present

## 2023-04-16 MED ORDER — AZITHROMYCIN 250 MG PO TABS: ORAL_TABLET | ORAL | 0 refills | Status: DC

## 2023-04-16 MED ORDER — HYDROCODONE BIT-HOMATROP MBR 5-1.5 MG/5ML PO SOLN: 5.0000 mL | Freq: Four times a day (QID) | ORAL | 0 refills | Status: AC | PRN

## 2023-04-16 NOTE — Progress Notes (Signed)
 Subjective:  Patient ID: Gregory Coffey, male    DOB: 01/02/1966  Age: 58 y.o. MRN: [id number]  CC: Cough (Pt states he is having a lingering cough a/w chest congestion and chest soreness x2 weeks after testing POS for COVID on 04/04/2023. Pt denies any f/n/v/d, body aches, chills/sweats att his time.)   HPI Gregory Coffey presents for Cough (Pt states he is having a lingering cough a/w chest congestion and chest soreness x2 weeks after testing POS for COVID on 04/04/2023. Pt denies any f/n/v/d, body aches, chills/sweats att his time.) No h/o asthma  Outpatient Medications Prior to Visit  Medication Sig Dispense Refill   ALPRAZolam  (XANAX ) 1 MG tablet Take 1 mg by mouth daily as needed.     cetirizine  (ZYRTEC ) 10 MG tablet Take 1 tablet (10 mg total) by mouth daily. 30 tablet 11   fluticasone  (FLONASE ) 50 MCG/ACT nasal spray Place into both nostrils daily.     No facility-administered medications prior to visit.    ROS: Review of Systems  Constitutional:  Negative for appetite change, fatigue and unexpected weight change.  HENT:  Negative for congestion, nosebleeds, sneezing, sore throat and trouble swallowing.   Eyes:  Negative for itching and visual disturbance.  Respiratory:  Positive for cough. Negative for choking, shortness of breath and wheezing.   Cardiovascular:  Negative for chest pain, palpitations and leg swelling.  Gastrointestinal:  Negative for abdominal distention, blood in stool, diarrhea and nausea.  Genitourinary:  Negative for frequency and hematuria.  Musculoskeletal:  Negative for back pain, gait problem, joint swelling and neck pain.  Skin:  Negative for rash.  Neurological:  Negative for dizziness, tremors, speech difficulty and weakness.  Psychiatric/Behavioral:  Negative for agitation, dysphoric mood and sleep disturbance. The patient is not nervous/anxious.     Objective:  BP 118/80 (BP Location: Right Arm, Patient Position: Sitting, Cuff Size: Normal)    Pulse (!) 109   Temp 98.4 F (36.9 C) (Oral)   Ht 6' 1 (1.854 m)   Wt 187 lb (84.8 kg)   SpO2 98%   BMI 24.67 kg/m   BP Readings from Last 3 Encounters:  04/16/23 118/80  11/16/22 123/83  09/19/22 116/78    Wt Readings from Last 3 Encounters:  04/16/23 187 lb (84.8 kg)  09/19/22 190 lb (86.2 kg)  08/31/22 192 lb 8 oz (87.3 kg)    Physical Exam Constitutional:      General: He is not in acute distress.    Appearance: He is well-developed.     Comments: NAD  HENT:     Nose: Congestion and rhinorrhea present.     Mouth/Throat:     Pharynx: Posterior oropharyngeal erythema present.  Eyes:     Conjunctiva/sclera: Conjunctivae normal.     Pupils: Pupils are equal, round, and reactive to light.  Neck:     Thyroid : No thyromegaly.     Vascular: No JVD.  Cardiovascular:     Rate and Rhythm: Normal rate and regular rhythm.     Heart sounds: Normal heart sounds. No murmur heard.    No friction rub. No gallop.  Pulmonary:     Effort: Pulmonary effort is normal. No respiratory distress.     Breath sounds: Normal breath sounds. No wheezing or rales.  Chest:     Chest wall: No tenderness.  Abdominal:     General: Bowel sounds are normal. There is no distension.     Palpations: Abdomen is soft. There is no mass.  Tenderness: There is no abdominal tenderness. There is no guarding or rebound.  Musculoskeletal:        General: No tenderness. Normal range of motion.     Cervical back: Normal range of motion.  Lymphadenopathy:     Cervical: No cervical adenopathy.  Skin:    General: Skin is warm and dry.     Findings: No rash.  Neurological:     Mental Status: He is alert and oriented to person, place, and time.     Cranial Nerves: No cranial nerve deficit.     Motor: No abnormal muscle tone.     Coordination: Coordination normal.     Gait: Gait normal.     Deep Tendon Reflexes: Reflexes are normal and symmetric.  Psychiatric:        Behavior: Behavior normal.         Thought Content: Thought content normal.        Judgment: Judgment normal.     Lab Results  Component Value Date   WBC 7.7 08/31/2022   HGB 15.5 08/31/2022   HCT 46.6 08/31/2022   PLT 267.0 08/31/2022   GLUCOSE 91 08/31/2022   CHOL 163 08/31/2022   TRIG 117.0 08/31/2022   HDL 42.90 08/31/2022   LDLCALC 97 08/31/2022   ALT 21 08/31/2022   AST 20 08/31/2022   NA 138 08/31/2022   K 4.5 08/31/2022   CL 105 08/31/2022   CREATININE 1.23 08/31/2022   BUN 19 08/31/2022   CO2 28 08/31/2022   TSH 3.44 08/31/2022   PSA 5.02 (H) 08/31/2022    MR PROSTATE W WO CONTRAST Result Date: 11/07/2022 CLINICAL DATA:  Elevated PSA. EXAM: MR PROSTATE WITHOUT AND WITH CONTRAST TECHNIQUE: Multiplanar multisequence MRI images were obtained of the pelvis centered about the prostate. Pre and post contrast images were obtained. CONTRAST:  9 mL Vueway  COMPARISON:  None Available. FINDINGS: Prostate: -- Peripheral Zone: Linear/wedge shaped hypointensities are noted on ADC; however, no focal ADC hypointense or high b-value DWI hyperintense nodules are identified. -- Transition/Central Zone: Mildly enlarged with diffuse involvement by BPH nodules. No non-circumscribed or otherwise suspicious appearing nodules identified. -- Measurements/Volume:  4.4 by 3.8 x 4.8 cm (volume = 42 cm^3) Transcapsular spread:  Absent Seminal vesicle involvement:  Absent Neurovascular bundle involvement:  Absent Pelvic adenopathy: None visualized Bone metastasis: None visualized Other: Mild sigmoid diverticulosis noted, without signs of diverticulitis. IMPRESSION: No radiographic evidence of high-grade prostate carcinoma. PI-RADS 2 (v.2.1): Low (clinically significant cancer unlikely) Electronically Signed   By: Norleen DELENA Kil M.D.   On: 11/07/2022 14:06    Assessment & Plan:   Problem List Items Addressed This Visit     COVID-19    #1 episode Post-COVID cough CXR Rx - Z pack, Hycodan prn - see Rx      Relevant Medications    azithromycin  (ZITHROMAX  Z-PAK) 250 MG tablet   Cough in adult - Primary    Post-COVID cough CXR Rx - Z pack, Hycodan prn - see Rx See Dr Mahlon if not better      Relevant Orders   DG Chest 2 View      Meds ordered this encounter  Medications   azithromycin  (ZITHROMAX  Z-PAK) 250 MG tablet    Sig: As directed    Dispense:  6 tablet    Refill:  0   HYDROcodone  bit-homatropine (HYCODAN) 5-1.5 MG/5ML syrup    Sig: Take 5 mLs by mouth every 6 (six) hours as needed for up to 10 days  for cough.    Dispense:  240 mL    Refill:  0      Follow-up: Return for f/u with PCP.  Marolyn Noel, MD

## 2023-04-16 NOTE — Assessment & Plan Note (Addendum)
  Post-COVID cough CXR Rx - Z pack, Hycodan prn - see Rx See Dr Beverely Low if not better

## 2023-04-16 NOTE — Assessment & Plan Note (Signed)
#  1 episode Post-COVID cough CXR Rx - Z pack, Hycodan prn - see Rx

## 2023-05-29 ENCOUNTER — Encounter: Payer: Self-pay | Admitting: Family Medicine

## 2023-05-29 ENCOUNTER — Ambulatory Visit (INDEPENDENT_AMBULATORY_CARE_PROVIDER_SITE_OTHER): Payer: BC Managed Care – PPO | Admitting: Family Medicine

## 2023-05-29 VITALS — BP 92/64 | HR 78 | Temp 97.8°F | Ht 73.0 in | Wt 188.2 lb

## 2023-05-29 DIAGNOSIS — R058 Other specified cough: Secondary | ICD-10-CM

## 2023-05-29 DIAGNOSIS — R0789 Other chest pain: Secondary | ICD-10-CM

## 2023-05-29 MED ORDER — ALBUTEROL SULFATE HFA 108 (90 BASE) MCG/ACT IN AERS: 2.0000 | INHALATION_SPRAY | Freq: Four times a day (QID) | RESPIRATORY_TRACT | 0 refills | Status: DC | PRN

## 2023-05-29 MED ORDER — PREDNISONE 10 MG PO TABS: ORAL_TABLET | ORAL | 0 refills | Status: DC

## 2023-05-29 NOTE — Patient Instructions (Signed)
 Follow up as needed or as scheduled START the Prednisone as directed- 3 pills at the same time x3 days, then 2 pills at the same time x3 days, then 1 pill daily.  Take w/ food  USE the albuterol inhaler- 2 puffs as needed- for shortness of breath, coughing spell, chest tightness, wheezing, etc Drink LOTS of fluids REST! Call with any questions or concerns Hang in there!!

## 2023-05-29 NOTE — Progress Notes (Unsigned)
   Subjective:    Patient ID: Gregory Coffey, male    DOB: 01-30-1966, 58 y.o.   MRN: [id number]  HPI Cough- pt was dx'd w/ COVID in December.  Was seen  04/16/23 for cough and was tx'd w/ Zpack and hydrocodone cough syrup.  CXR clear.  Pt reports feeling 'much better' since then but cough continues.  Will be triggered by things like cold air.  Cough is loud, productive of clear sputum.  'it just doesn't feel like I can get a full breath'.      Review of Systems For ROS see HPI     Objective:   Physical Exam Vitals reviewed.  Constitutional:      General: He is not in acute distress.    Appearance: Normal appearance. He is well-developed.  HENT:     Head: Normocephalic and atraumatic.     Right Ear: Tympanic membrane normal.     Left Ear: Tympanic membrane normal.     Nose: No mucosal edema or rhinorrhea.     Right Sinus: No maxillary sinus tenderness or frontal sinus tenderness.     Left Sinus: No maxillary sinus tenderness or frontal sinus tenderness.     Mouth/Throat:     Pharynx: No oropharyngeal exudate or posterior oropharyngeal erythema.  Eyes:     Conjunctiva/sclera: Conjunctivae normal.     Pupils: Pupils are equal, round, and reactive to light.  Cardiovascular:     Rate and Rhythm: Normal rate and regular rhythm.     Heart sounds: Normal heart sounds.  Pulmonary:     Effort: Pulmonary effort is normal. No respiratory distress.     Breath sounds: Normal breath sounds. No wheezing.     Comments: + dry cough Musculoskeletal:     Cervical back: Normal range of motion and neck supple.  Lymphadenopathy:     Cervical: No cervical adenopathy.  Skin:    General: Skin is warm and dry.  Neurological:     General: No focal deficit present.     Mental Status: He is alert and oriented to person, place, and time.  Psychiatric:        Mood and Affect: Mood normal.        Behavior: Behavior normal.        Thought Content: Thought content normal.           Assessment &  Plan:  Post viral cough- new.  Pt's chest tightness is likely due to airway inflammation.  EKG WNL.  Will start prednisone taper and albuterol prn.  Reviewed supportive care and red flags that should prompt return.  Pt expressed understanding and is in agreement w/ plan.

## 2023-05-30 ENCOUNTER — Encounter: Payer: Self-pay | Admitting: Family Medicine

## 2023-09-05 ENCOUNTER — Encounter: Payer: Self-pay | Admitting: Family Medicine

## 2023-09-05 ENCOUNTER — Ambulatory Visit (INDEPENDENT_AMBULATORY_CARE_PROVIDER_SITE_OTHER): Payer: BC Managed Care – PPO | Admitting: Family Medicine

## 2023-09-05 VITALS — BP 112/76 | HR 89 | Temp 97.9°F | Ht 73.0 in | Wt 183.8 lb

## 2023-09-05 DIAGNOSIS — E7800 Pure hypercholesterolemia, unspecified: Secondary | ICD-10-CM

## 2023-09-05 DIAGNOSIS — Z0000 Encounter for general adult medical examination without abnormal findings: Secondary | ICD-10-CM

## 2023-09-05 LAB — LIPID PANEL
Cholesterol: 184 mg/dL (ref 0–200)
HDL: 51.2 mg/dL (ref >39.00)
LDL Cholesterol: 117 mg/dL — ABNORMAL HIGH (ref 0–99)
NonHDL: 132.31
Total CHOL/HDL Ratio: 4
Triglycerides: 78 mg/dL (ref 0.0–149.0)
VLDL: 15.6 mg/dL (ref 0.0–40.0)

## 2023-09-05 LAB — HEPATIC FUNCTION PANEL
ALT: 22 U/L (ref 0–53)
AST: 21 U/L (ref 0–37)
Albumin: 4.5 g/dL (ref 3.5–5.2)
Alkaline Phosphatase: 81 U/L (ref 39–117)
Bilirubin, Direct: 0.2 mg/dL (ref 0.0–0.3)
Total Bilirubin: 0.7 mg/dL (ref 0.2–1.2)
Total Protein: 7.2 g/dL (ref 6.0–8.3)

## 2023-09-05 LAB — BASIC METABOLIC PANEL WITH GFR
BUN: 18 mg/dL (ref 6–23)
CO2: 29 meq/L (ref 19–32)
Calcium: 10 mg/dL (ref 8.4–10.5)
Chloride: 104 meq/L (ref 96–112)
Creatinine, Ser: 1.06 mg/dL (ref 0.40–1.50)
GFR: 77.5 mL/min (ref >60.00)
Glucose, Bld: 87 mg/dL (ref 70–99)
Potassium: 4.4 meq/L (ref 3.5–5.1)
Sodium: 139 meq/L (ref 135–145)

## 2023-09-05 LAB — CBC WITH DIFFERENTIAL/PLATELET
Basophils Absolute: 0 10*3/uL (ref 0.0–0.1)
Basophils Relative: 0.4 % (ref 0.0–3.0)
Eosinophils Absolute: 0.1 10*3/uL (ref 0.0–0.7)
Eosinophils Relative: 1.7 % (ref 0.0–5.0)
HCT: 48.2 % (ref 39.0–52.0)
Hemoglobin: 16.2 g/dL (ref 13.0–17.0)
Lymphocytes Relative: 22.4 % (ref 12.0–46.0)
Lymphs Abs: 1.7 10*3/uL (ref 0.7–4.0)
MCHC: 33.6 g/dL (ref 30.0–36.0)
MCV: 85.7 fl (ref 78.0–100.0)
Monocytes Absolute: 0.5 10*3/uL (ref 0.1–1.0)
Monocytes Relative: 6.2 % (ref 3.0–12.0)
Neutro Abs: 5.2 10*3/uL (ref 1.4–7.7)
Neutrophils Relative %: 69.3 % (ref 43.0–77.0)
Platelets: 265 10*3/uL (ref 150.0–400.0)
RBC: 5.62 Mil/uL (ref 4.22–5.81)
RDW: 13 % (ref 11.5–15.5)
WBC: 7.5 10*3/uL (ref 4.0–10.5)

## 2023-09-05 LAB — TSH: TSH: 3.61 u[IU]/mL (ref 0.35–5.50)

## 2023-09-05 NOTE — Assessment & Plan Note (Signed)
 Pt's PE WNL.  UTD on colonoscopy, Tdap.  Check labs.  Anticipatory guidance provided.

## 2023-09-05 NOTE — Patient Instructions (Signed)
Follow up in 1 year or as needed We'll notify you of your lab results and make any changes if needed Keep up the good work on healthy diet and regular exercise- you look great!!! Call with any questions or concerns Stay Safe!  Stay Healthy! Have a great summer!!! 

## 2023-09-05 NOTE — Progress Notes (Signed)
   Subjective:    Patient ID: Gregory Coffey, male    DOB: 01/30/1966, 58 y.o.   MRN: [id number]  HPI CPE- UTD on colonoscopy, Tdap  Patient Care Team    Relationship Specialty Notifications Start End  Jess Morita, MD PCP - General Family Medicine  03/05/12   Ricki Charon, MD Consulting Physician Ophthalmology  06/01/15   Christ Courier, NP-C Nurse Practitioner Urology  05/29/23     Health Maintenance  Topic Date Due   COVID-19 Vaccine (4 - 2024-25 season) 09/21/2023 (Originally 12/10/2022)   Zoster Vaccines- Shingrix (1 of 2) 12/06/2023 (Originally 06/05/2015)   INFLUENZA VACCINE  11/09/2023   Colonoscopy  02/18/2025   DTaP/Tdap/Td (2 - Td or Tdap) 11/17/2026   Hepatitis C Screening  Completed   HIV Screening  Completed   HPV VACCINES  Aged Out   Meningococcal B Vaccine  Aged Out      Review of Systems Patient reports no vision/hearing changes, anorexia, fever ,adenopathy, persistant/recurrent hoarseness, swallowing issues, chest pain, palpitations, edema, persistant/recurrent cough, hemoptysis, dyspnea (rest,exertional, paroxysmal nocturnal), gastrointestinal  bleeding (melena, rectal bleeding), abdominal pain, excessive heart burn, GU symptoms (dysuria, hematuria, voiding/incontinence issues) syncope, focal weakness, memory loss, numbness & tingling, skin/hair/nail changes, depression, anxiety, abnormal bruising/bleeding, musculoskeletal symptoms/signs.     Objective:   Physical Exam General Appearance:    Alert, cooperative, no distress, appears stated age  Head:    Normocephalic, without obvious abnormality, atraumatic  Eyes:    PERRL, conjunctiva/corneas clear, EOM's intact both eyes       Ears:    Normal TM's and external ear canals, both ears  Nose:   Nares normal, septum midline, mucosa normal, no drainage   or sinus tenderness  Throat:   Lips, mucosa, and tongue normal; teeth and gums normal  Neck:   Supple, symmetrical, trachea midline, no  adenopathy;       thyroid :  No enlargement/tenderness/nodules  Back:     Symmetric, no curvature, ROM normal, no CVA tenderness  Lungs:     Clear to auscultation bilaterally, respirations unlabored  Chest wall:    No tenderness or deformity  Heart:    Regular rate and rhythm, S1 and S2 normal, no murmur, rub   or gallop  Abdomen:     Soft, non-tender, bowel sounds active all four quadrants,    no masses, no organomegaly  Genitalia:    deferred  Rectal:    Extremities:   Extremities normal, atraumatic, no cyanosis or edema  Pulses:   2+ and symmetric all extremities  Skin:   Skin color, texture, turgor normal, no rashes or lesions  Lymph nodes:   Cervical, supraclavicular, and axillary nodes normal  Neurologic:   CNII-XII intact. Normal strength, sensation and reflexes      throughout          Assessment & Plan:

## 2023-09-06 ENCOUNTER — Ambulatory Visit: Payer: Self-pay | Admitting: Family Medicine

## 2023-09-10 ENCOUNTER — Encounter: Payer: Self-pay | Admitting: Family Medicine

## 2023-09-10 ENCOUNTER — Telehealth: Payer: Self-pay

## 2023-09-10 NOTE — Telephone Encounter (Signed)
 Copied from CRM [id number]. Topic: General - Other >> Sep 10, 2023 12:02 PM Adrionna Y wrote: Reason for CRM: Patient is calling because the forms faxed over, the year for the exam had his birth year instead of 2025 They just need it whited out and the corrected date. or if a new form is needed patient can bring it  he can be reached by the number on file

## 2023-09-10 NOTE — Telephone Encounter (Signed)
 Spoke with pt and he is going to send this form on MyChart for us  to fill out and fax to the number on the form

## 2023-09-11 NOTE — Telephone Encounter (Signed)
 This was received via MyChart and has been sent to Dr Paulla Bossier

## 2024-09-05 ENCOUNTER — Encounter: Admitting: Family Medicine
